# Patient Record
Sex: Male | Born: 1980 | Race: Black or African American | Hispanic: No | Marital: Married | Smoking: Former smoker
Health system: Southern US, Community
[De-identification: ages and names within clinical notes are randomized; demographics above are authoritative.]

## PROBLEM LIST (undated history)

## (undated) DIAGNOSIS — I10 Essential (primary) hypertension: Secondary | ICD-10-CM

## (undated) DIAGNOSIS — I509 Heart failure, unspecified: Secondary | ICD-10-CM

## (undated) DIAGNOSIS — I714 Abdominal aortic aneurysm, without rupture, unspecified: Secondary | ICD-10-CM

## (undated) DIAGNOSIS — I723 Aneurysm of iliac artery: Secondary | ICD-10-CM

## (undated) DIAGNOSIS — N2889 Other specified disorders of kidney and ureter: Secondary | ICD-10-CM

## (undated) HISTORY — DX: Abdominal aortic aneurysm, without rupture, unspecified: I71.40

## (undated) HISTORY — DX: Abdominal aortic aneurysm, without rupture: I71.4

## (undated) HISTORY — DX: Other specified disorders of kidney and ureter: N28.89

## (undated) HISTORY — DX: Aneurysm of iliac artery: I72.3

---

## 2014-03-11 ENCOUNTER — Encounter (HOSPITAL_COMMUNITY): Payer: Self-pay | Admitting: Emergency Medicine

## 2014-03-11 ENCOUNTER — Emergency Department (HOSPITAL_COMMUNITY)
Admission: EM | Admit: 2014-03-11 | Discharge: 2014-03-11 | Disposition: A | Discharge status: Home / Self Care | Payer: Self-pay | Attending: Emergency Medicine | Admitting: Emergency Medicine

## 2014-03-11 DIAGNOSIS — I509 Heart failure, unspecified: Secondary | ICD-10-CM | POA: Insufficient documentation

## 2014-03-11 DIAGNOSIS — R55 Syncope and collapse: Secondary | ICD-10-CM | POA: Insufficient documentation

## 2014-03-11 DIAGNOSIS — I10 Essential (primary) hypertension: Secondary | ICD-10-CM | POA: Insufficient documentation

## 2014-03-11 HISTORY — DX: Essential (primary) hypertension: I10

## 2014-03-11 HISTORY — DX: Heart failure, unspecified: I50.9

## 2014-03-11 LAB — BASIC METABOLIC PANEL
Anion gap: 15 (ref 5–15)
BUN: 30 mg/dL — AB (ref 6–23)
CHLORIDE: 102 meq/L (ref 96–112)
CO2: 20 mEq/L (ref 19–32)
Calcium: 9.2 mg/dL (ref 8.4–10.5)
Creatinine, Ser: 1.4 mg/dL — ABNORMAL HIGH (ref 0.50–1.35)
GFR calc Af Amer: 75 mL/min — ABNORMAL LOW (ref >90)
GFR, EST NON AFRICAN AMERICAN: 64 mL/min — AB (ref >90)
Glucose, Bld: 105 mg/dL — ABNORMAL HIGH (ref 70–99)
Potassium: 4.8 mEq/L (ref 3.7–5.3)
Sodium: 137 mEq/L (ref 137–147)

## 2014-03-11 LAB — CBC
HEMATOCRIT: 42.4 % (ref 39.0–52.0)
HEMOGLOBIN: 15.3 g/dL (ref 13.0–17.0)
MCH: 27.5 pg (ref 26.0–34.0)
MCHC: 36.1 g/dL — AB (ref 30.0–36.0)
MCV: 76.3 fL — ABNORMAL LOW (ref 78.0–100.0)
Platelets: 203 10*3/uL (ref 150–400)
RBC: 5.56 MIL/uL (ref 4.22–5.81)
RDW: 13.5 % (ref 11.5–15.5)
WBC: 7.2 10*3/uL (ref 4.0–10.5)

## 2014-03-11 LAB — I-STAT TROPONIN, ED: Troponin i, poc: 0 ng/mL (ref 0.00–0.08)

## 2014-03-11 NOTE — ED Notes (Signed)
Pt ambulated in hall, tolerated well. Denied lightheadedness or dizziness. Pt given orange juice after walking.

## 2014-03-11 NOTE — ED Notes (Signed)
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

## 2014-03-11 NOTE — ED Notes (Signed)
Feeling bad, lightheaded, and weakness prior to work.  Has had significant weight loss. "feels congested."  bp 95/60, hr 100, sr, ems gave ns 300; piv 18 ga. Alert and oriented.

## 2014-03-11 NOTE — Discharge Instructions (Signed)
You need to be seen by your regular doctor in the next couple of days to a week.  Your kidney function test was slightly higher than baseline, you will need to get plenty to drink and have this test rechecked in one week to ensure that it is improving.  Near-Syncope Near-syncope (commonly known as near fainting) is sudden weakness, dizziness, or feeling like you might pass out. During an episode of near-syncope, you may also develop pale skin, have tunnel vision, or feel sick to your stomach (nauseous). Near-syncope may occur when getting up after sitting or while standing for a long time. It is caused by a sudden decrease in blood flow to the brain. This decrease can result from various causes or triggers, most of which are not serious. However, because near-syncope can sometimes be a sign of something serious, a medical evaluation is required. The specific cause is often not determined. HOME CARE INSTRUCTIONS  Monitor your condition for any changes. The following actions may help to alleviate any discomfort you are experiencing:  Have someone stay with you until you feel stable.  Lie down right away and prop your feet up if you start feeling like you might faint. Breathe deeply and steadily. Wait until all the symptoms have passed. Most of these episodes last only a few minutes. You may feel tired for several hours.   Drink enough fluids to keep your urine clear or pale yellow.   If you are taking blood pressure or heart medicine, get up slowly when seated or lying down. Take several minutes to sit and then stand. This can reduce dizziness.  Follow up with your health care provider as directed. SEEK IMMEDIATE MEDICAL CARE IF:   You have a severe headache.   You have unusual pain in the chest, abdomen, or back.   You are bleeding from the mouth or rectum, or you have black or tarry stool.   You have an irregular or very fast heartbeat.   You have repeated fainting or have  seizure-like jerking during an episode.   You faint when sitting or lying down.   You have confusion.   You have difficulty walking.   You have severe weakness.   You have vision problems.  MAKE SURE YOU:   Understand these instructions.  Will watch your condition.  Will get help right away if you are not doing well or get worse. Document Released: 05/15/2005 Document Revised: 05/20/2013 Document Reviewed: 10/18/2012 [facility] Patient Information 2015 [place], [place]. This information is not intended to replace advice given to you by your health care provider. Make sure you discuss any questions you have with your health care provider.

## 2014-03-11 NOTE — ED Provider Notes (Signed)
Medical screening examination/treatment/procedure(s) were performed by non-physician practitioner and as supervising physician I was immediately available for consultation/collaboration.    Johnna Acosta, MD 03/11/14 2116

## 2014-03-11 NOTE — ED Notes (Signed)
Pt given [place] sandwich and applesauce per PA's request

## 2014-03-11 NOTE — ED Provider Notes (Signed)
CSN: [id number]     Arrival date & time 03/11/14  0747 History   First MD Initiated Contact with Patient 03/11/14 0756     Chief Complaint  Patient presents with  . Dizziness     (Consider location/radiation/quality/duration/timing/severity/associated sxs/prior Treatment) HPI Comments:  Patient presents emergency department with chief complaint of dizziness. He states that the symptoms started this morning at around 5:00 after he awoke. He states the dizziness felt like a feeling of lightheadedness. He denies any room spinning sensation or vertigo. He denies any associated chest pain or shortness of breath. States that when he took his blood pressure, it was lower than normal. His employer advised that he come to the emergency department for evaluation.  The history is provided by the patient. No language interpreter was used.    Past Medical History  Diagnosis Date  . Hypertension   . CHF (congestive heart failure)    No past surgical history on file. History reviewed. No pertinent family history. History  Substance Use Topics  . Smoking status: Never Smoker   . Smokeless tobacco: Not on file  . Alcohol Use: No    Review of Systems  All other systems reviewed and are negative.     Allergies  Review of patient's allergies indicates no known allergies.  Home Medications   Prior to Admission medications   Not on File   BP 101/74  Pulse 68  Temp(Src) 98.5 F (36.9 C) (Oral)  Resp 18  SpO2 100% Physical Exam  Nursing note and vitals reviewed. Constitutional: He is oriented to person, place, and time. He appears well-developed and well-nourished.  HENT:  Head: Normocephalic and atraumatic.  Eyes: Conjunctivae and EOM are normal. Pupils are equal, round, and reactive to light. Right eye exhibits no discharge. Left eye exhibits no discharge. No scleral icterus.  Neck: Normal range of motion. Neck supple. No JVD present.  Cardiovascular: Normal rate, regular rhythm  and normal heart sounds.  Exam reveals no gallop and no friction rub.   No murmur heard. Pulmonary/Chest: Effort normal and breath sounds normal. No respiratory distress. He has no wheezes. He has no rales. He exhibits no tenderness.  Clear to auscultation bilaterally  Abdominal: Soft. He exhibits no distension and no mass. There is no tenderness. There is no rebound and no guarding.  Musculoskeletal: Normal range of motion. He exhibits no edema and no tenderness.  Neurological: He is alert and oriented to person, place, and time.  Skin: Skin is warm and dry.  Psychiatric: He has a normal mood and affect. His behavior is normal. Judgment and thought content normal.    ED Course  Procedures (including critical care time) Results for orders placed during the hospital encounter of 03/11/14  CBC      Result Value Ref Range   WBC 7.2  4.0 - 10.5 K/uL   RBC 5.56  4.22 - 5.81 MIL/uL   Hemoglobin 15.3  13.0 - 17.0 g/dL   HCT 42.4  39.0 - 52.0 %   MCV 76.3 (*) 78.0 - 100.0 fL   MCH 27.5  26.0 - 34.0 pg   MCHC 36.1 (*) 30.0 - 36.0 g/dL   RDW 13.5  11.5 - 15.5 %   Platelets 203  150 - 400 K/uL  BASIC METABOLIC PANEL      Result Value Ref Range   Sodium 137  137 - 147 mEq/L   Potassium 4.8  3.7 - 5.3 mEq/L   Chloride 102  96 - 112  mEq/L   CO2 20  19 - 32 mEq/L   Glucose, Bld 105 (*) 70 - 99 mg/dL   BUN 30 (*) 6 - 23 mg/dL   Creatinine, Ser 1.40 (*) 0.50 - 1.35 mg/dL   Calcium 9.2  8.4 - 10.5 mg/dL   GFR calc non Af Amer 64 (*) >90 mL/min   GFR calc Af Amer 75 (*) >90 mL/min   Anion gap 15  5 - 15  I-STAT TROPOININ, ED      Result Value Ref Range   Troponin i, poc 0.00  0.00 - 0.08 ng/mL   Comment 3            No results found.   Imaging Review No results found.   EKG Interpretation   Date/Time:  Wednesday March 11 2014 07:56:29 EDT Ventricular Rate:  63 PR Interval:  154 QRS Duration: 92 QT Interval:  377 QTC Calculation: 386 R Axis:   8 Text Interpretation:  Sinus  rhythm Borderline ST elevation, anterolateral  leads Abnormal ekg No old tracing to compare Confirmed by MILLER  MD,  [place] ([id number]) on 03/11/2014 8:27:34 AM      MDM   Final diagnoses:  Near syncope    Patient with feeling of dizziness. He does have a history of CHF. Will check labs and EKG and orthostatic vitals.  Patient is feeling better now.  9:34 AM EKG is unremarkable.  No arrhythmias.  CBC is stable, no evidence of anemia. BNP is normal. No chest pain or shortness of breath. Does not sound vertiginous.  Onset was this morning, doubt any intracranial process. Cr is 1.4.  No baseline to compare to.  Recommend fluids and recheck in 1 week. Discussed with Dr. Sabra Heck, who agrees with plan. Will fluid challenge, ambulate the patient, and anticipate discharge to home.   Montine Circle, PA-C 03/11/14 1026

## 2015-01-04 ENCOUNTER — Encounter (HOSPITAL_COMMUNITY): Payer: Self-pay | Admitting: Emergency Medicine

## 2015-01-04 ENCOUNTER — Emergency Department (HOSPITAL_COMMUNITY)
Admission: EM | Admit: 2015-01-04 | Discharge: 2015-01-04 | Disposition: A | Discharge status: Home / Self Care | Payer: Self-pay | Attending: Emergency Medicine | Admitting: Emergency Medicine

## 2015-01-04 DIAGNOSIS — Z79899 Other long term (current) drug therapy: Secondary | ICD-10-CM | POA: Insufficient documentation

## 2015-01-04 DIAGNOSIS — M109 Gout, unspecified: Secondary | ICD-10-CM

## 2015-01-04 DIAGNOSIS — I509 Heart failure, unspecified: Secondary | ICD-10-CM | POA: Insufficient documentation

## 2015-01-04 DIAGNOSIS — Z720 Tobacco use: Secondary | ICD-10-CM | POA: Insufficient documentation

## 2015-01-04 DIAGNOSIS — I10 Essential (primary) hypertension: Secondary | ICD-10-CM | POA: Insufficient documentation

## 2015-01-04 MED ORDER — PREDNISONE 20 MG PO TABS
60.0000 mg | ORAL_TABLET | Freq: Once | ORAL | Status: AC
  Administered 2015-01-04: 60 mg via ORAL
  Filled 2015-01-04: qty 3

## 2015-01-04 MED ORDER — OXYCODONE-ACETAMINOPHEN 5-325 MG PO TABS
2.0000 | ORAL_TABLET | Freq: Once | ORAL | Status: AC
  Administered 2015-01-04: 2 via ORAL
  Filled 2015-01-04: qty 2

## 2015-01-04 MED ORDER — PREDNISONE 20 MG PO TABS: 40.0000 mg | ORAL_TABLET | Freq: Every day | ORAL | Status: DC

## 2015-01-04 MED ORDER — OXYCODONE-ACETAMINOPHEN 5-325 MG PO TABS: 1.0000 | ORAL_TABLET | ORAL | Status: DC | PRN

## 2015-01-04 NOTE — ED Notes (Signed)
Pt reports that his "gout" started to "flair up" last Thursday.  His left elbow, knee and toes are swollen and are causing him pain.

## 2015-01-04 NOTE — ED Provider Notes (Signed)
CSN: [id number]     Arrival date & time 01/04/15  0600 History   First MD Initiated Contact with Patient 01/04/15 0636     Chief Complaint  Patient presents with  . Joint Swelling     (Consider location/radiation/quality/duration/timing/severity/associated sxs/prior Treatment) The history is provided by the patient and medical records.   This is a 34 year old male with history of hypertension and congestive heart failure, presenting to the ED for joint swelling. Patient has a history of gout, states he feels he is having a "flareup". Patient states swelling began on Thursday (12/31/14) but has been worsening since this time.  He states swelling and pain of his left elbow and left knee.  He states joints feel warm to touch.  States similar symptoms in the past with prior gout flares.  He denies numbness or weakness.  No new injuries, trauma, or falls. No fever, chills, sweats.  Remains ambulatory without difficulty.  Denies consumption of red meat, fish, or alcohol recently.  He does not take any maintenance medications for his gout.  Taken motrin PTA without relief.  Past Medical History  Diagnosis Date  . Hypertension   . CHF (congestive heart failure)    History reviewed. No pertinent past surgical history. History reviewed. No pertinent family history. History  Substance Use Topics  . Smoking status: Current Every Day Smoker -- 1.00 packs/day    Types: Cigarettes  . Smokeless tobacco: Not on file  . Alcohol Use: Yes     Comment: daily "when I can"    Review of Systems  Musculoskeletal: Positive for joint swelling and arthralgias.  All other systems reviewed and are negative.     Allergies  Review of patient's allergies indicates no known allergies.  Home Medications   Prior to Admission medications   Medication Sig Start Date End Date Taking? Authorizing Provider  amLODipine (NORVASC) 10 MG tablet Take 10 mg by mouth daily.    Historical Provider, MD  benazepril  (LOTENSIN) 40 MG tablet Take 40 mg by mouth daily.    Historical Provider, MD  carvedilol (COREG) 25 MG tablet Take 25 mg by mouth 2 (two) times daily with a meal.    Historical Provider, MD  furosemide (LASIX) 80 MG tablet Take 80 mg by mouth daily.    Historical Provider, MD  simvastatin (ZOCOR) 20 MG tablet Take 20 mg by mouth every evening.    Historical Provider, MD   BP 140/87 mmHg  Pulse 79  Temp(Src) 98.4 F (36.9 C) (Oral)  Resp 18  Ht 5\' 4"  (1.626 m)  Wt 165 lb (74.844 kg)  BMI 28.31 kg/m2  SpO2 98%   Physical Exam  Constitutional: He is oriented to person, place, and time. He appears well-developed and well-nourished. No distress.  HENT:  Head: Normocephalic and atraumatic.  Mouth/Throat: Oropharynx is clear and moist.  Eyes: Conjunctivae and EOM are normal. Pupils are equal, round, and reactive to light.  Neck: Normal range of motion. Neck supple.  Cardiovascular: Normal rate, regular rhythm and normal heart sounds.   Pulmonary/Chest: Effort normal and breath sounds normal. No respiratory distress. He has no wheezes.  Abdominal: Soft. Bowel sounds are normal.  Musculoskeletal: Normal range of motion. He exhibits no edema.  Swelling and tenderness of left elbow and left knee; slight erythema and warmth to touch noted; no lymphangitis or streaking of either extremity; extremities are neurovascularly intact; compartments soft and easily compressible  Neurological: He is alert and oriented to person, place, and time.  Skin: Skin is warm and dry. He is not diaphoretic.  Psychiatric: He has a normal mood and affect.  Nursing note and vitals reviewed.   ED Course  Procedures (including critical care time) Labs Review Labs Reviewed - No data to display  Imaging Review No results found.   EKG Interpretation None      MDM   Final diagnoses:  Acute gout of left elbow, unspecified cause  Acute gout of left knee, unspecified cause   34 year old male here with gout  flare. He is afebrile, nontoxic. He does have swelling and tenderness of left elbow and left knee with slight erythema and warmth to touch.  Patient has history of gout with similar symptoms. I do not suspect septic joint or DVT at this time. Patient will be started on Percocet and prednisone, first dose given in ED.  He does not have a PCP currently, will be given follow-up [facility].  Discussed plan with patient, he/she acknowledged understanding and agreed with plan of care.  Return precautions given for new or worsening symptoms.  Larene Pickett, PA-C 01/04/15 5329  Veryl Speak, MD 01/07/15 [phone]

## 2015-01-04 NOTE — Discharge Instructions (Signed)
Take the prescribed medication as directed. Follow-up with the [facility]-- call to make appt. Return to the ED for new or worsening symptoms.

## 2015-01-22 ENCOUNTER — Ambulatory Visit (HOSPITAL_COMMUNITY)
Admission: RE | Admit: 2015-01-22 | Discharge: 2015-01-22 | Disposition: A | Discharge status: Home / Self Care | Payer: Self-pay | Source: Ambulatory Visit | Attending: Family Medicine | Admitting: Family Medicine

## 2015-01-22 ENCOUNTER — Ambulatory Visit (INDEPENDENT_AMBULATORY_CARE_PROVIDER_SITE_OTHER): Payer: Self-pay | Admitting: Family Medicine

## 2015-01-22 ENCOUNTER — Encounter: Payer: Self-pay | Admitting: Family Medicine

## 2015-01-22 VITALS — BP 138/93 | HR 72 | Temp 98.5°F | Resp 16 | Ht 64.0 in | Wt 173.0 lb

## 2015-01-22 DIAGNOSIS — I509 Heart failure, unspecified: Secondary | ICD-10-CM

## 2015-01-22 DIAGNOSIS — Z0000 Encounter for general adult medical examination without abnormal findings: Secondary | ICD-10-CM

## 2015-01-22 DIAGNOSIS — I10 Essential (primary) hypertension: Secondary | ICD-10-CM | POA: Insufficient documentation

## 2015-01-22 LAB — CBC WITH DIFFERENTIAL/PLATELET
BASOS ABS: 0 10*3/uL (ref 0.0–0.1)
BASOS PCT: 0 % (ref 0–1)
Eosinophils Absolute: 0.2 10*3/uL (ref 0.0–0.7)
Eosinophils Relative: 3 % (ref 0–5)
HCT: 44 % (ref 39.0–52.0)
HEMOGLOBIN: 15.4 g/dL (ref 13.0–17.0)
Lymphocytes Relative: 21 % (ref 12–46)
Lymphs Abs: 1.6 10*3/uL (ref 0.7–4.0)
MCH: 27.2 pg (ref 26.0–34.0)
MCHC: 35 g/dL (ref 30.0–36.0)
MCV: 77.7 fL — ABNORMAL LOW (ref 78.0–100.0)
MONO ABS: 0.7 10*3/uL (ref 0.1–1.0)
Monocytes Relative: 9 % (ref 3–12)
Neutro Abs: 5.2 10*3/uL (ref 1.7–7.7)
Neutrophils Relative %: 67 % (ref 43–77)
Platelets: 239 10*3/uL (ref 150–400)
RBC: 5.66 MIL/uL (ref 4.22–5.81)
RDW: 16.1 % — ABNORMAL HIGH (ref 11.5–15.5)
WBC: 7.8 10*3/uL (ref 4.0–10.5)

## 2015-01-22 LAB — COMPLETE METABOLIC PANEL WITH GFR
ALBUMIN: 3.5 g/dL — AB (ref 3.6–5.1)
ALK PHOS: 67 U/L (ref 40–115)
ALT: 19 U/L (ref 9–46)
AST: 18 U/L (ref 10–40)
BUN: 8 mg/dL (ref 7–25)
CALCIUM: 8.9 mg/dL (ref 8.6–10.3)
CO2: 25 mmol/L (ref 20–31)
Chloride: 104 mmol/L (ref 98–110)
Creat: 0.95 mg/dL (ref 0.60–1.35)
GFR, Est African American: >89 mL/min (ref >=60)
GFR, Est Non African American: >89 mL/min (ref >=60)
Glucose, Bld: 79 mg/dL (ref 65–99)
Potassium: 3.8 mmol/L (ref 3.5–5.3)
Sodium: 141 mmol/L (ref 135–146)
Total Bilirubin: 0.6 mg/dL (ref 0.2–1.2)
Total Protein: 6.1 g/dL (ref 6.1–8.1)

## 2015-01-22 LAB — LIPID PANEL
CHOLESTEROL: 161 mg/dL (ref 125–200)
HDL: 39 mg/dL — AB (ref >=40)
LDL Cholesterol: 97 mg/dL (ref <130)
TRIGLYCERIDES: 125 mg/dL (ref <150)
Total CHOL/HDL Ratio: 4.1 Ratio (ref <=5.0)
VLDL: 25 mg/dL (ref <30)

## 2015-01-22 LAB — TSH: TSH: 0.714 u[IU]/mL (ref 0.350–4.500)

## 2015-01-22 MED ORDER — LISINOPRIL-HYDROCHLOROTHIAZIDE 20-25 MG PO TABS: 1.0000 | ORAL_TABLET | Freq: Every day | ORAL | Status: DC

## 2015-01-22 NOTE — Progress Notes (Signed)
Patient ID: Nicholas Larson, male   DOB: 22-Aug-1980, 35 y.o.   MRN: [id number]   Nicholas Larson, is a 34 y.o. male  [id number]  [id number]  DOB - Mar 08, 1981  CC:  Chief Complaint  Patient presents with  . Establish Care    refills        HPI: Nicholas Larson is a 34 y.o. male here to establish care. He has a history of hypertension and CHF. He reports being diagnosed with CHF several years ago. He has been on amlodipine 10, coreg 25, Lasix 80 and benazapril 80, all daily. However he has been out for about a month. Prior to that he had recently been seen in ED for dizziness and low blood pressure.  He reports about 3 years he lost 70 pounds and his medications were never adjusted. His BP today is 138/93 off medication. He denies any shortness of breath, cough, wheezng or pedal edema since he has been of Lasix for this month. He also has a history of an occassional gout flare. He has had none in a couple of years. He denies other chronic illnesses.  No Known Allergies Past Medical History  Diagnosis Date  . Hypertension   . CHF (congestive heart failure)    Current Outpatient Prescriptions on File Prior to Visit  Medication Sig Dispense Refill  . amLODipine (NORVASC) 10 MG tablet Take 10 mg by mouth daily.    . carvedilol (COREG) 25 MG tablet Take 25 mg by mouth 2 (two) times daily with a meal.    . furosemide (LASIX) 80 MG tablet Take 80 mg by mouth daily.    Marland Kitchen oxyCODONE-acetaminophen (PERCOCET/ROXICET) 5-325 MG per tablet Take 1 tablet by mouth every 4 (four) hours as needed. 15 tablet 0  . simvastatin (ZOCOR) 20 MG tablet Take 20 mg by mouth every evening.    . predniSONE (DELTASONE) 20 MG tablet Take 2 tablets (40 mg total) by mouth daily. Take 40 mg by mouth daily for 3 days, then 20mg  by mouth daily for 3 days, then 10mg  daily for 3 days (Patient not taking: Reported on 01/22/2015) 12 tablet 0   No current facility-administered medications on file prior to visit.   No family  history on file. Social History   Social History  . Marital Status: Married    Spouse Name: N/A  . Number of Children: N/A  . Years of Education: N/A   Occupational History  . Not on file.   Social History Main Topics  . Smoking status: Current Every Day Smoker -- 1.00 packs/day    Types: Cigarettes  . Smokeless tobacco: Not on file  . Alcohol Use: Yes     Comment: daily "when I can"  . Drug Use: No  . Sexual Activity: Not on file   Other Topics Concern  . Not on file   Social History Narrative    Review of Systems: Constitutional: Negative for fever, chills, appetite change, weight loss,  Fatigue. Skin: Negative for rashes or lesions of concern. HENT: Negative for ear pain, ear discharge.nose bleeds Eyes: Negative for pain, discharge, redness, itching and visual disturbance. Neck: Negative for pain, stiffness Respiratory: Negative for cough, shortness of breath,   Cardiovascular: Negative for chest pain, palpitations and leg swelling. Gastrointestinal: Negative for abdominal pain, nausea, vomiting, diarrhea, constipations Genitourinary: Negative for dysuria, urgency, frequency, hematuria,  Musculoskeletal: Negative for back pain, joint pain, joint  swelling, and gait problem.Negative for weakness. Neurological: Negative for dizziness, tremors, seizures, syncope,   light-headedness,  numbness and headaches.  Hematological: Negative for easy bruising or bleeding Psychiatric/Behavioral: Negative for depression, anxiety, decreased concentration, confusion   Objective:   Filed Vitals:   01/22/15 1140  BP: 138/93  Pulse: 72  Temp: 98.5 F (36.9 C)  Resp: 16    Physical Exam: Constitutional: Patient appears well-developed and well-nourished. No distress. HENT: Normocephalic, atraumatic, External right and left ear normal. Oropharynx is clear and moist.  Eyes: Conjunctivae and EOM are normal. PERRLA, no scleral icterus. Neck: Normal ROM. Neck supple. No  lymphadenopathy, No thyromegaly. CVS: RRR, S1/S2 +, no murmurs, no gallops, no rubs Pulmonary: Effort and breath sounds normal, no stridor, rhonchi, wheezes, rales.  Abdominal: Soft. Normoactive BS,, no distension, tenderness, rebound or guarding.  Musculoskeletal: Normal range of motion. No edema and no tenderness.  Neuro: Alert.Normal muscle tone coordination. Non-focal Skin: Skin is warm and dry. No rash noted. Not diaphoretic. No erythema. No pallor. Psychiatric: Normal mood and affect. Behavior, judgment, thought content normal.  Lab Results  Component Value Date   WBC 7.2 03/11/2014   HGB 15.3 03/11/2014   HCT 42.4 03/11/2014   MCV 76.3* 03/11/2014   PLT 203 03/11/2014   Lab Results  Component Value Date   CREATININE 1.40* 03/11/2014   BUN 30* 03/11/2014   NA 137 03/11/2014   K 4.8 03/11/2014   CL 102 03/11/2014   CO2 20 03/11/2014    No results found for: HGBA1C Lipid Panel  No results found for: CHOL, TRIG, HDL, CHOLHDL, VLDL, LDLCALC     Assessment and plan:   1. Health care maintenance  - COMPLETE METABOLIC PANEL WITH GFR - CBC with Differential - Lipid panel - TSH - Vitamin D 1,25 dihydroxy  2. Congestive heart failure, unspecified congestive heart failure chronicity, unspecified congestive heart failure type  - DG Chest 2 View; Future  3. Hypertension -lisinopril/hctz 20/25. One po daily, #90 with 3 refills.   Return in about 3 months (around 04/24/2015), or if symptoms worsen or fail to improve.  The patient was given clear instructions to go to ER or return to medical center if symptoms don't improve, worsen or new problems develop. The patient verbalized understanding.    Micheline Chapman FNP  01/22/2015, 12:21 PM

## 2015-01-22 NOTE — Patient Instructions (Signed)
You can go to [facility] at your convenience for a chest x-ray I am prescribing lisinopril/hctz for your blood pressure. Take one daily Come back in 2 weeks for BP check with nurse    DASH Eating Plan DASH stands for "Dietary Approaches to Stop Hypertension." The DASH eating plan is a healthy eating plan that has been shown to reduce high blood pressure (hypertension). Additional health benefits may include reducing the risk of type 2 diabetes mellitus, heart disease, and stroke. The DASH eating plan may also help with weight loss. WHAT DO I NEED TO KNOW ABOUT THE DASH EATING PLAN? For the DASH eating plan, you will follow these general guidelines:  Choose foods with a percent daily value for sodium of less than 5% (as listed on the food label).  Use salt-free seasonings or herbs instead of table salt or sea salt.  Check with your health care provider or pharmacist before using salt substitutes.  Eat lower-sodium products, often labeled as "lower sodium" or "no salt added."  Eat fresh foods.  Eat more vegetables, fruits, and low-fat dairy products.  Choose whole grains. Look for the word "whole" as the first word in the ingredient list.  Choose fish and skinless chicken or [place] more often than red meat. Limit fish, poultry, and meat to 6 oz (170 g) each day.  Limit sweets, desserts, sugars, and sugary drinks.  Choose heart-healthy fats.  Limit cheese to 1 oz (28 g) per day.  Eat more home-cooked food and less restaurant, buffet, and fast food.  Limit fried foods.  Cook foods using methods other than frying.  Limit canned vegetables. If you do use them, rinse them well to decrease the sodium.  When eating at a restaurant, ask that your food be prepared with less salt, or no salt if possible. WHAT FOODS CAN I EAT? Seek help from a dietitian for individual calorie needs. Grains Whole grain or whole wheat bread. Brown rice. Whole grain or whole wheat pasta.  Quinoa, bulgur, and whole grain cereals. Low-sodium cereals. Corn or whole wheat flour tortillas. Whole grain cornbread. Whole grain crackers. Low-sodium crackers. Vegetables Fresh or frozen vegetables (raw, steamed, roasted, or grilled). Low-sodium or reduced-sodium tomato and vegetable juices. Low-sodium or reduced-sodium tomato sauce and paste. Low-sodium or reduced-sodium canned vegetables.  Fruits All fresh, canned (in natural juice), or frozen fruits. Meat and Other Protein Products Ground beef (85% or leaner), grass-fed beef, or beef trimmed of fat. Skinless chicken or [place]. Ground chicken or [place]. Pork trimmed of fat. All fish and seafood. Eggs. Dried beans, peas, or lentils. Unsalted nuts and seeds. Unsalted canned beans. Dairy Low-fat dairy products, such as skim or 1% milk, 2% or reduced-fat cheeses, low-fat ricotta or cottage cheese, or plain low-fat yogurt. Low-sodium or reduced-sodium cheeses. Fats and Oils Tub margarines without trans fats. Light or reduced-fat mayonnaise and salad dressings (reduced sodium). Avocado. Safflower, olive, or canola oils. Natural peanut or almond butter. Other Unsalted popcorn and pretzels. The items listed above may not be a complete list of recommended foods or beverages. Contact your dietitian for more options. WHAT FOODS ARE NOT RECOMMENDED? Grains White bread. White pasta. White rice. Refined cornbread. Bagels and croissants. Crackers that contain trans fat. Vegetables Creamed or fried vegetables. Vegetables in a cheese sauce. Regular canned vegetables. Regular canned tomato sauce and paste. Regular tomato and vegetable juices. Fruits Dried fruits. Canned fruit in light or heavy syrup. Fruit juice. Meat and Other Protein Products Fatty cuts of meat. Ribs,  chicken wings, bacon, sausage, bologna, salami, chitterlings, fatback, hot dogs, bratwurst, and packaged luncheon meats. Salted nuts and seeds. Canned beans with salt. Dairy Whole or 2%  milk, cream, half-and-half, and cream cheese. Whole-fat or sweetened yogurt. Full-fat cheeses or blue cheese. Nondairy creamers and whipped toppings. Processed cheese, cheese spreads, or cheese curds. Condiments Onion and garlic salt, seasoned salt, table salt, and sea salt. Canned and packaged gravies. Worcestershire sauce. Tartar sauce. Barbecue sauce. Teriyaki sauce. Soy sauce, including reduced sodium. Steak sauce. Fish sauce. Oyster sauce. Cocktail sauce. Horseradish. Ketchup and mustard. Meat flavorings and tenderizers. Bouillon cubes. Hot sauce. Tabasco sauce. Marinades. Taco seasonings. Relishes. Fats and Oils Butter, stick margarine, lard, shortening, ghee, and bacon fat. Coconut, palm kernel, or palm oils. Regular salad dressings. Other Pickles and olives. Salted popcorn and pretzels. The items listed above may not be a complete list of foods and beverages to avoid. Contact your dietitian for more information. WHERE CAN I FIND MORE INFORMATION? National Heart, [employer], and [employer] Institute: travelstabloid.com Document Released: 05/04/2011 Document Revised: 09/29/2013 Document Reviewed: 03/19/2013 [facility] Patient Information 2015 [place], [place]. This information is not intended to replace advice given to you by your health care provider. Make sure you discuss any questions you have with your health care provider.

## 2015-01-25 LAB — VITAMIN D 1,25 DIHYDROXY
Vitamin D 1, 25 (OH)2 Total: 43 pg/mL (ref 18–72)
Vitamin D3 1, 25 (OH)2: 43 pg/mL

## 2015-01-26 ENCOUNTER — Telehealth: Payer: Self-pay

## 2015-01-26 NOTE — Telephone Encounter (Signed)
-----   Message from Micheline Chapman, NP sent at 01/24/2015  6:15 PM EDT ----- Chest x-ray shows not congestive heart failure.

## 2015-01-26 NOTE — Telephone Encounter (Signed)
Left message 01/26/2015 @ 9:38am regarding chest xray which showed no congestive heart failure and to call back if any questions. Thanks!

## 2015-06-22 ENCOUNTER — Emergency Department (HOSPITAL_COMMUNITY): Payer: Self-pay

## 2015-06-22 ENCOUNTER — Encounter (HOSPITAL_COMMUNITY): Payer: Self-pay | Admitting: Emergency Medicine

## 2015-06-22 ENCOUNTER — Emergency Department (HOSPITAL_COMMUNITY)
Admission: EM | Admit: 2015-06-22 | Discharge: 2015-06-22 | Disposition: A | Discharge status: Home / Self Care | Payer: Self-pay | Attending: Emergency Medicine | Admitting: Emergency Medicine

## 2015-06-22 DIAGNOSIS — F17210 Nicotine dependence, cigarettes, uncomplicated: Secondary | ICD-10-CM | POA: Insufficient documentation

## 2015-06-22 DIAGNOSIS — S60211A Contusion of right wrist, initial encounter: Secondary | ICD-10-CM | POA: Insufficient documentation

## 2015-06-22 DIAGNOSIS — T148XXA Other injury of unspecified body region, initial encounter: Secondary | ICD-10-CM

## 2015-06-22 DIAGNOSIS — S60811A Abrasion of right wrist, initial encounter: Secondary | ICD-10-CM | POA: Insufficient documentation

## 2015-06-22 DIAGNOSIS — I509 Heart failure, unspecified: Secondary | ICD-10-CM | POA: Insufficient documentation

## 2015-06-22 DIAGNOSIS — W230XXA Caught, crushed, jammed, or pinched between moving objects, initial encounter: Secondary | ICD-10-CM | POA: Insufficient documentation

## 2015-06-22 DIAGNOSIS — Y9289 Other specified places as the place of occurrence of the external cause: Secondary | ICD-10-CM | POA: Insufficient documentation

## 2015-06-22 DIAGNOSIS — Z79899 Other long term (current) drug therapy: Secondary | ICD-10-CM | POA: Insufficient documentation

## 2015-06-22 DIAGNOSIS — I10 Essential (primary) hypertension: Secondary | ICD-10-CM | POA: Insufficient documentation

## 2015-06-22 DIAGNOSIS — Y998 Other external cause status: Secondary | ICD-10-CM | POA: Insufficient documentation

## 2015-06-22 DIAGNOSIS — Y9389 Activity, other specified: Secondary | ICD-10-CM | POA: Insufficient documentation

## 2015-06-22 NOTE — ED Notes (Signed)
Pt states a jack-hammer fell onto right wrist this am. Right wrist with swelling and redness.

## 2015-06-22 NOTE — ED Provider Notes (Signed)
CSN: [id number]     Arrival date & time 06/22/15  1129 History  By signing my name below, I, Nicholas Larson, attest that this documentation has been prepared under the direction and in the presence of Glendell Docker, NP. Electronically Signed: Evonnie Dawes, ED Scribe 06/22/2015 at 12:42 PM.  Chief Complaint  Patient presents with  . Wrist Injury   The history is provided by the patient. No language interpreter was used.   HPI Comments: Nicholas Larson is a 35 y.o. male who presents to the Emergency Department complaining of constant right wrist pain onset 9 AM today. Pt was changing a tire earlier today when the jack slipped. Pt's wrist was caught between tire and edge of vehicle. Patient did not use any ice or medication. No other complaints reported at this time.   Past Medical History  Diagnosis Date  . Hypertension   . CHF (congestive heart failure)    No past surgical history on file. No family history on file. Social History  Substance Use Topics  . Smoking status: Current Every Day Smoker -- 1.00 packs/day    Types: Cigarettes  . Smokeless tobacco: Not on file  . Alcohol Use: Yes     Comment: daily "when I can"    Review of Systems  Musculoskeletal: Positive for arthralgias (Right Wrist).  All other systems reviewed and are negative.  Allergies  Review of patient's allergies indicates no known allergies.  Home Medications   Prior to Admission medications   Medication Sig Start Date End Date Taking? Authorizing Provider  amLODipine (NORVASC) 10 MG tablet Take 10 mg by mouth daily.    Historical Provider, MD  carvedilol (COREG) 25 MG tablet Take 25 mg by mouth 2 (two) times daily with a meal.    Historical Provider, MD  furosemide (LASIX) 80 MG tablet Take 80 mg by mouth daily.    Historical Provider, MD  lisinopril-hydrochlorothiazide (PRINZIDE,ZESTORETIC) 20-25 MG per tablet Take 1 tablet by mouth daily. 01/22/15   Micheline Chapman, NP   oxyCODONE-acetaminophen (PERCOCET/ROXICET) 5-325 MG per tablet Take 1 tablet by mouth every 4 (four) hours as needed. 01/04/15   Larene Pickett, PA-C  predniSONE (DELTASONE) 20 MG tablet Take 2 tablets (40 mg total) by mouth daily. Take 40 mg by mouth daily for 3 days, then 20mg  by mouth daily for 3 days, then 10mg  daily for 3 days Patient not taking: Reported on 01/22/2015 01/04/15   Larene Pickett, PA-C  simvastatin (ZOCOR) 20 MG tablet Take 20 mg by mouth every evening.    Historical Provider, MD   BP 143/93 mmHg  Pulse 107  Temp(Src) 98.3 F (36.8 C) (Oral)  Resp 18  Ht 5\' 4"  (1.626 m)  Wt 166 lb (75.297 kg)  BMI 28.48 kg/m2  SpO2 98%  Physical Exam  Constitutional: He is oriented to person, place, and time. He appears well-developed and well-nourished. No distress.  HENT:  Head: Normocephalic and atraumatic.  Eyes: Conjunctivae and EOM are normal.  Neck: Neck supple. No tracheal deviation present.  Cardiovascular: Normal rate.   Pulmonary/Chest: Effort normal. No respiratory distress.  Musculoskeletal: Normal range of motion.  Neurological: He is alert and oriented to person, place, and time.  Skin: Skin is warm and dry. Abrasion noted. There is erythema.  Some redness and edema. Abrasion on the right wrist. Good bilateral strength and light touch sensation. NVI. Good ROM.   Psychiatric: He has a normal mood and affect. His behavior is normal.  Nursing note  and vitals reviewed.  ED Course  Procedures (including critical care time) DIAGNOSTIC STUDIES: Oxygen Saturation is 98% on RA, normal by my interpretation.    COORDINATION OF CARE: 11:58 AM-Discussed treatment plan which includes DG right wrist  with pt at bedside and pt agreed to plan.   Labs Review Labs Reviewed - No data to display  Imaging Review Dg Wrist Complete Right  06/22/2015  CLINICAL DATA:  Right wrist pain and deformity after injury changing tire. EXAM: RIGHT WRIST - COMPLETE 3+ VIEW COMPARISON:  None.  FINDINGS: The mineralization and alignment are normal. There is no evidence of acute fracture or dislocation. The joint spaces are maintained. No foreign bodies are identified. There is prominent dorsal soft tissue swelling on the lateral view. IMPRESSION: Dorsal soft tissue swelling without evidence of acute fracture or dislocation. Electronically Signed   By: Richardean Sale M.D.   On: 06/22/2015 12:30   I have personally reviewed and evaluated these images and lab results as part of my medical decision-making.   EKG Interpretation None      MDM   Final diagnoses:  Contusion  Abrasion    X-ray negative. Will treat with ace wrap for comfort  I personally performed the services described in this documentation, which was scribed in my presence. The recorded information has been reviewed and is accurate.      Glendell Docker, NP 06/22/15 1243  Gareth Morgan, MD 06/22/15 2321

## 2015-06-22 NOTE — ED Notes (Signed)
Pt ambulates independently and with steady gait at time of discharge. Discharge instructions and follow up information reviewed with patient. No other questions or concerns voiced at this time.  

## 2016-03-04 ENCOUNTER — Other Ambulatory Visit: Payer: Self-pay | Admitting: Family Medicine

## 2016-03-20 ENCOUNTER — Other Ambulatory Visit: Payer: Self-pay | Admitting: Family Medicine

## 2016-03-20 DIAGNOSIS — I509 Heart failure, unspecified: Secondary | ICD-10-CM

## 2016-04-06 ENCOUNTER — Other Ambulatory Visit (HOSPITAL_COMMUNITY): Payer: BLUE CROSS/BLUE SHIELD

## 2016-04-24 ENCOUNTER — Other Ambulatory Visit (HOSPITAL_COMMUNITY): Payer: BLUE CROSS/BLUE SHIELD

## 2016-05-15 ENCOUNTER — Ambulatory Visit (HOSPITAL_COMMUNITY): Payer: BLUE CROSS/BLUE SHIELD | Attending: Internal Medicine

## 2016-05-15 ENCOUNTER — Other Ambulatory Visit: Payer: Self-pay

## 2016-05-15 DIAGNOSIS — I509 Heart failure, unspecified: Secondary | ICD-10-CM | POA: Insufficient documentation

## 2016-05-15 DIAGNOSIS — I253 Aneurysm of heart: Secondary | ICD-10-CM | POA: Insufficient documentation

## 2016-05-15 DIAGNOSIS — Z720 Tobacco use: Secondary | ICD-10-CM | POA: Diagnosis not present

## 2016-05-15 DIAGNOSIS — I110 Hypertensive heart disease with heart failure: Secondary | ICD-10-CM | POA: Insufficient documentation

## 2016-05-25 ENCOUNTER — Emergency Department (HOSPITAL_COMMUNITY): Payer: BLUE CROSS/BLUE SHIELD

## 2016-05-25 ENCOUNTER — Encounter (HOSPITAL_COMMUNITY): Payer: Self-pay | Admitting: Emergency Medicine

## 2016-05-25 ENCOUNTER — Inpatient Hospital Stay (HOSPITAL_COMMUNITY)
Admission: EM | Admit: 2016-05-25 | Discharge: 2016-05-28 | DRG: 271 | Disposition: A | Discharge status: Home / Self Care | Payer: BLUE CROSS/BLUE SHIELD | Attending: Internal Medicine | Admitting: Internal Medicine

## 2016-05-25 DIAGNOSIS — I10 Essential (primary) hypertension: Secondary | ICD-10-CM

## 2016-05-25 DIAGNOSIS — Z79899 Other long term (current) drug therapy: Secondary | ICD-10-CM

## 2016-05-25 DIAGNOSIS — Z6830 Body mass index (BMI) 30.0-30.9, adult: Secondary | ICD-10-CM | POA: Diagnosis not present

## 2016-05-25 DIAGNOSIS — I110 Hypertensive heart disease with heart failure: Secondary | ICD-10-CM | POA: Diagnosis present

## 2016-05-25 DIAGNOSIS — I714 Abdominal aortic aneurysm, without rupture, unspecified: Secondary | ICD-10-CM

## 2016-05-25 DIAGNOSIS — I723 Aneurysm of iliac artery: Secondary | ICD-10-CM | POA: Diagnosis present

## 2016-05-25 DIAGNOSIS — I5032 Chronic diastolic (congestive) heart failure: Secondary | ICD-10-CM | POA: Diagnosis present

## 2016-05-25 DIAGNOSIS — N2889 Other specified disorders of kidney and ureter: Secondary | ICD-10-CM | POA: Diagnosis present

## 2016-05-25 DIAGNOSIS — F17210 Nicotine dependence, cigarettes, uncomplicated: Secondary | ICD-10-CM | POA: Diagnosis present

## 2016-05-25 DIAGNOSIS — I671 Cerebral aneurysm, nonruptured: Secondary | ICD-10-CM | POA: Diagnosis not present

## 2016-05-25 DIAGNOSIS — Z8679 Personal history of other diseases of the circulatory system: Secondary | ICD-10-CM

## 2016-05-25 DIAGNOSIS — Z9889 Other specified postprocedural states: Secondary | ICD-10-CM

## 2016-05-25 LAB — URINALYSIS, ROUTINE W REFLEX MICROSCOPIC
Bacteria, UA: NONE SEEN
Bilirubin Urine: NEGATIVE
Glucose, UA: NEGATIVE mg/dL
Ketones, ur: NEGATIVE mg/dL
Leukocytes, UA: NEGATIVE
Nitrite: NEGATIVE
PROTEIN: 100 mg/dL — AB
Specific Gravity, Urine: 1.009 (ref 1.005–1.030)
Squamous Epithelial / LPF: NONE SEEN
pH: 6 (ref 5.0–8.0)

## 2016-05-25 LAB — CBC
HEMATOCRIT: 50 % (ref 39.0–52.0)
HEMOGLOBIN: 17.9 g/dL — AB (ref 13.0–17.0)
MCH: 28.1 pg (ref 26.0–34.0)
MCHC: 35.8 g/dL (ref 30.0–36.0)
MCV: 78.4 fL (ref 78.0–100.0)
Platelets: 175 10*3/uL (ref 150–400)
RBC: 6.38 MIL/uL — ABNORMAL HIGH (ref 4.22–5.81)
RDW: 13.9 % (ref 11.5–15.5)
WBC: 11.5 10*3/uL — AB (ref 4.0–10.5)

## 2016-05-25 LAB — COMPREHENSIVE METABOLIC PANEL
ALBUMIN: 3.8 g/dL (ref 3.5–5.0)
ALK PHOS: 82 U/L (ref 38–126)
ALT: 20 U/L (ref 17–63)
AST: 18 U/L (ref 15–41)
Anion gap: 9 (ref 5–15)
BILIRUBIN TOTAL: 0.8 mg/dL (ref 0.3–1.2)
BUN: 12 mg/dL (ref 6–20)
CALCIUM: 8.9 mg/dL (ref 8.9–10.3)
CO2: 27 mmol/L (ref 22–32)
Chloride: 101 mmol/L (ref 101–111)
Creatinine, Ser: 0.9 mg/dL (ref 0.61–1.24)
GFR calc Af Amer: >60 mL/min (ref >60)
GFR calc non Af Amer: >60 mL/min (ref >60)
GLUCOSE: 94 mg/dL (ref 65–99)
Potassium: 3.5 mmol/L (ref 3.5–5.1)
Sodium: 137 mmol/L (ref 135–145)
TOTAL PROTEIN: 7.3 g/dL (ref 6.5–8.1)

## 2016-05-25 LAB — MRSA PCR SCREENING: MRSA by PCR: NEGATIVE

## 2016-05-25 MED ORDER — NITROGLYCERIN IN D5W 200-5 MCG/ML-% IV SOLN
0.0000 ug/min | INTRAVENOUS | Status: DC
  Administered 2016-05-25: 5 ug/min via INTRAVENOUS
  Filled 2016-05-25: qty 250

## 2016-05-25 MED ORDER — SODIUM CHLORIDE 0.9% FLUSH
3.0000 mL | Freq: Two times a day (BID) | INTRAVENOUS | Status: DC
  Administered 2016-05-25 – 2016-05-28 (×4): 3 mL via INTRAVENOUS

## 2016-05-25 MED ORDER — OXYCODONE-ACETAMINOPHEN 5-325 MG PO TABS
1.0000 | ORAL_TABLET | Freq: Once | ORAL | Status: AC
  Administered 2016-05-25: 1 via ORAL
  Filled 2016-05-25: qty 1

## 2016-05-25 MED ORDER — ACETAMINOPHEN 325 MG PO TABS: 650.0000 mg | ORAL_TABLET | Freq: Four times a day (QID) | ORAL | Status: DC | PRN

## 2016-05-25 MED ORDER — LABETALOL HCL 5 MG/ML IV SOLN
10.0000 mg | Freq: Once | INTRAVENOUS | Status: AC
  Administered 2016-05-25: 10 mg via INTRAVENOUS
  Filled 2016-05-25: qty 4

## 2016-05-25 MED ORDER — ONDANSETRON HCL 4 MG/2ML IJ SOLN: 4.0000 mg | Freq: Four times a day (QID) | INTRAMUSCULAR | Status: DC | PRN

## 2016-05-25 MED ORDER — CEFAZOLIN IN D5W 1 GM/50ML IV SOLN
1.0000 g | INTRAVENOUS | Status: DC
  Filled 2016-05-25 (×2): qty 50

## 2016-05-25 MED ORDER — ONDANSETRON HCL 4 MG PO TABS: 4.0000 mg | ORAL_TABLET | Freq: Four times a day (QID) | ORAL | Status: DC | PRN

## 2016-05-25 MED ORDER — TRAMADOL HCL 50 MG PO TABS
50.0000 mg | ORAL_TABLET | Freq: Four times a day (QID) | ORAL | Status: DC | PRN
  Administered 2016-05-25: 50 mg via ORAL
  Filled 2016-05-25 (×2): qty 1

## 2016-05-25 MED ORDER — IOPAMIDOL (ISOVUE-370) INJECTION 76%
INTRAVENOUS | Status: AC
  Administered 2016-05-25: 100 mL via INTRAVENOUS
  Filled 2016-05-25: qty 100

## 2016-05-25 MED ORDER — LABETALOL HCL 5 MG/ML IV SOLN
10.0000 mg | INTRAVENOUS | Status: DC | PRN
  Administered 2016-05-25 – 2016-05-26 (×3): 10 mg via INTRAVENOUS
  Filled 2016-05-25 (×4): qty 4

## 2016-05-25 MED ORDER — ACETAMINOPHEN 650 MG RE SUPP: 650.0000 mg | Freq: Four times a day (QID) | RECTAL | Status: DC | PRN

## 2016-05-25 MED ORDER — HYDRALAZINE HCL 20 MG/ML IJ SOLN
10.0000 mg | Freq: Once | INTRAMUSCULAR | Status: AC
  Administered 2016-05-25: 10 mg via INTRAVENOUS
  Filled 2016-05-25: qty 1

## 2016-05-25 NOTE — ED Provider Notes (Signed)
[facility] DEPT Provider Note   CSN: [id number] Arrival date & time: 05/25/16  [id number]     History   Chief Complaint Chief Complaint  Patient presents with  . Groin Pain    HPI Nicholas Larson is a 35 y.o. male history of hypertension, CHF presenting with left groin pain since last night at 10 PM. Patient states that he pain woke him up and gradually worsened. He states the pain is continuous dull pain and is now a 4/10. Patient has not taken anything for the pain. Patient reports some nausea and hasn't had any food today. Patient reports having flatulence and bowel movement today with no changes, no blood noted. Patient denies any hematuria or urinary symptoms. Patient denies any tenderness to palpation. Patient denies any chest pain, shortness of breath, vomiting, renal stone history or family history of renal stones.     Groin Pain  Pertinent negatives include no chest pain, no abdominal pain and no shortness of breath.    Past Medical History:  Diagnosis Date  . CHF (congestive heart failure) ([place])   . Hypertension     There are no active problems to display for this patient.   History reviewed. No pertinent surgical history.     Home Medications    Prior to Admission medications   Medication Sig Start Date End Date Taking? Authorizing Provider  lisinopril (PRINIVIL,ZESTRIL) 20 MG tablet Take 20 mg by mouth daily.   Yes Historical Provider, MD  lisinopril-hydrochlorothiazide (PRINZIDE,ZESTORETIC) 20-25 MG per tablet Take 1 tablet by mouth daily. Patient not taking: Reported on 05/25/2016 01/22/15   Micheline Chapman, NP  oxyCODONE-acetaminophen (PERCOCET/ROXICET) 5-325 MG per tablet Take 1 tablet by mouth every 4 (four) hours as needed. Patient not taking: Reported on 05/25/2016 01/04/15   Larene Pickett, PA-C  predniSONE (DELTASONE) 20 MG tablet Take 2 tablets (40 mg total) by mouth daily. Take 40 mg by mouth daily for 3 days, then 20mg  by mouth daily for 3 days, then  10mg  daily for 3 days Patient not taking: Reported on 05/25/2016 01/04/15   Larene Pickett, PA-C    Family History Family History  Problem Relation Age of Onset  . Hypertension Mother   . Aneurysm Father     head    Social History Social History  Substance Use Topics  . Smoking status: Former Smoker    Packs/day: 1.00    Types: Cigarettes  . Smokeless tobacco: Not on file  . Alcohol use Yes     Comment: daily "when I can"     Allergies   Patient has no known allergies.   Review of Systems Review of Systems  Constitutional: Negative for chills and fever.  HENT: Negative for sore throat.   Eyes: Negative for visual disturbance.  Respiratory: Negative for cough and shortness of breath.   Cardiovascular: Negative for chest pain and palpitations.  Gastrointestinal: Positive for nausea. Negative for abdominal pain, blood in stool, diarrhea and vomiting.  Genitourinary: Negative for difficulty urinating, dysuria and hematuria.  Musculoskeletal: Negative for arthralgias and back pain.       Left groin pain.   Skin: Negative for color change, rash and wound.  Neurological: Negative for syncope.     Physical Exam Updated Vital Signs BP (!) 188/119 (BP Location: Right Arm)   Pulse 74   Temp 99.2 F (37.3 C) (Oral)   Resp 12   SpO2 98%   Physical Exam  Constitutional: He is oriented to person, place, and time.  He appears well-developed and well-nourished.  HENT:  Head: Normocephalic and atraumatic.  Nose: Nose normal.  Eyes: Conjunctivae and EOM are normal. Pupils are equal, round, and reactive to light.  Neck: Normal range of motion. Neck supple.  Cardiovascular: Normal rate and normal heart sounds.   Pulmonary/Chest: Effort normal and breath sounds normal. No respiratory distress. He exhibits no tenderness.  Abdominal: Soft. Bowel sounds are normal. He exhibits no pulsatile midline mass and no mass. There is no tenderness. There is no rebound and no guarding.    Genitourinary: Testes normal and penis normal. Cremasteric reflex is present. Right testis shows no mass, no swelling and no tenderness. Left testis shows no mass, no swelling and no tenderness. No penile tenderness.  Genitourinary Comments: Exam was chaperoned.  Musculoskeletal: Normal range of motion.  Lymphadenopathy: No inguinal adenopathy noted on the right or left side.  Neurological: He is alert and oriented to person, place, and time.  Skin: Skin is warm. Capillary refill takes less than 2 seconds.  Psychiatric: He has a normal mood and affect. His behavior is normal.  Nursing note and vitals reviewed.    ED Treatments / Results  Labs (all labs ordered are listed, but only abnormal results are displayed) Labs Reviewed  URINALYSIS, ROUTINE W REFLEX MICROSCOPIC - Abnormal; Notable for the following:       Result Value   Hgb urine dipstick SMALL (*)    Protein, ur 100 (*)    All other components within normal limits  CBC - Abnormal; Notable for the following:    WBC 11.5 (*)    RBC 6.38 (*)    Hemoglobin 17.9 (*)    All other components within normal limits  COMPREHENSIVE METABOLIC PANEL    EKG  EKG Interpretation None       Radiology Ct Renal Stone Study  Result Date: 05/25/2016 CLINICAL DATA:  Left side groin pain beginning last night EXAM: CT ABDOMEN AND PELVIS WITHOUT CONTRAST TECHNIQUE: Multidetector CT imaging of the abdomen and pelvis was performed following the standard protocol without IV contrast. COMPARISON:  None. FINDINGS: Lower chest: Heart is normal size.  Lung bases clear.  No effusions. Hepatobiliary: No focal hepatic abnormality. Gallbladder unremarkable. Pancreas: No focal abnormality or ductal dilatation. Spleen: No focal abnormality.  Normal size. Adrenals/Urinary Tract: Indeterminate exophytic 2.4 cm lesion off the upper pole of the right kidney. This is not definitively cystic on this unenhanced study. This warrants additional evaluation. Second of  low-density area in the upper pole of the right kidney measures 15 mm and may represent a cyst but difficult to characterize on this unenhanced study. Small low-density area within the upper pole of the left kidney also difficult to characterize. No hydronephrosis. Adrenal glands and urinary bladder unremarkable. No renal or ureteral stones. No hydronephrosis. Stomach/Bowel: Appendix is normal. Stomach, large and small bowel grossly unremarkable. Vascular/Lymphatic: Saccular partially calcified aneurysm off the left side of the aorta measures up to 3.6 cm. Aneurysmal dilatation of the left common iliac artery measuring up to 3.1 cm. No adenopathy. Reproductive: No visible focal abnormality. Other: No free fluid or free air. Musculoskeletal: No acute bony abnormality or focal bone lesion. 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 Recommend vascular surgical consultation. Indeterminate exophytic lesion off the upper pole of the right kidney, 2.4 cm. This warrants additional workup with contrast-enhanced CT or  MRI. Other additional low-density areas in the kidneys may be cysts but recommend attention on additional imaging. Normal appendix. No renal or ureteral stones.  No hydronephrosis. Electronically Signed   By: Rolm Baptise M.D.   On: 05/25/2016 10:53   Ct Angio Abd/pel W/ And/or W/o  Result Date: 05/25/2016 CLINICAL DATA:  Presented to the ED with left groin pain. Abdominal aortic aneurysm. Renal lesion. EXAM: CTA ABDOMEN AND PELVIS WITH CONTRAST TECHNIQUE: Multidetector CT imaging of the abdomen and pelvis was performed using the standard protocol during bolus administration of intravenous contrast. Multiplanar reconstructed images and MIPs were obtained and reviewed to evaluate the vascular anatomy. CONTRAST:  100 mL Isovue 370 IV COMPARISON:  Earlier  noncontrast scan of the same day FINDINGS: VASCULAR Aorta: Visualized distal descending thoracic and suprarenal segments unremarkable. There is a saccular aneurysm from the left lateral aspect of the infrarenal aorta measuring up to 4 cm maximum transverse diameter, extending across the bifurcation into the common iliac artery. There is some eccentric mural thrombus as well as slow flow within the lesion. There is some curvilinear calcification in the wall of the aneurysmal segment. No dissection flap. No significant stenosis. No adjacent retroperitoneal hemorrhage or inflammatory change. Celiac: Patent without evidence of aneurysm, dissection, vasculitis or significant stenosis. SMA: Patent without evidence of aneurysm, dissection, vasculitis or significant stenosis. Replaced right hepatic arterial supply, an anatomic variant. Renals: Both renal arteries are patent without evidence of aneurysm, dissection, vasculitis, fibromuscular dysplasia or significant stenosis. IMA: Patent, arising from the saccular aneurysm. Inflow: Aneurysmal dilatation of the left common iliac artery, contiguous with the saccular distal aortic aneurysm, measured up to a 3.6 cm maximum transverse diameter, extending to the common iliac bifurcation. There is some eccentric mural thrombus in the aneurysmal segment. Mild inflammatory changes in the adjacent retroperitoneal fat. No dissection flap. No stenosis. Left internal and external iliac arteries are unremarkable. Right iliac arterial system has some scattered calcified plaque, the common iliac ectatic up to 15 mm. Proximal Outflow: Bilateral common femoral and visualized portions of the superficial and profunda femoral arteries are patent without evidence of aneurysm, dissection, vasculitis or significant stenosis. Veins: Patent iliac venous system, IVC, bilateral renal veins, hepatic veins, portal vein, SMV, splenic vein. Review of the MIP images confirms the above findings. NON-VASCULAR  Lower chest: No acute abnormality. Hepatobiliary: No focal liver abnormality is seen. No gallstones, gallbladder wall thickening, or biliary dilatation. Pancreas: Unremarkable. No pancreatic ductal dilatation or surrounding inflammatory changes. Spleen: Normal in size without focal abnormality. Adrenals/Urinary Tract: Normal adrenal glands. 2.7 cm enhancing partially exophytic upper pole right renal mass. Smaller bilateral renal cysts. No hydronephrosis. Urinary bladder is physiologically distended. Stomach/Bowel: Stomach is within normal limits. Appendix appears normal. No evidence of bowel wall thickening, distention, or inflammatory changes. Lymphatic: No adenopathy localized. Reproductive: Prostate is unremarkable. Other: No ascites.  No free air. Musculoskeletal: No acute or significant osseous findings. IMPRESSION: VASCULAR 1. 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 Findings may represent complications of infectious or autoimmune arteritis; atypical appearance for penetrating atheromatous ulcer or typical atherosclerotic change, with a paucity of disease elsewhere; no suggestion of dissection. The inflammatory changes around the common left common iliac artery along with regional pain suggest an acute exacerbation with elevated risk of rupture. Recommend vascular surgical consultation. NON-VASCULAR 1. 2.7 cm enhancing right upper pole renal mass suggesting renal cell carcinoma. Recommend urologic follow-up. Electronically Signed   By: Keturah Barre  Vernard Gambles M.D.   On: 05/25/2016 13:29    Procedures Procedures (including critical care time)  Medications Ordered in ED Medications  oxyCODONE-acetaminophen (PERCOCET/ROXICET) 5-325 MG per tablet 1 tablet (1 tablet Oral Given 05/25/16 1107)  labetalol (NORMODYNE,TRANDATE) injection 10 mg (10 mg Intravenous Given 05/25/16 1123)  iopamidol (ISOVUE-370) 76 % injection (100 mLs  Intravenous Contrast Given 05/25/16 1149)  labetalol (NORMODYNE,TRANDATE) injection 10 mg (10 mg Intravenous Given 05/25/16 1543)     Initial Impression / Assessment and Plan / ED Course  I have reviewed the triage vital signs and the nursing notes.  Pertinent labs & imaging results that were available during my care of the patient were reviewed by me and considered in my medical decision making (see chart for details).  Clinical Course   Patient is a 35 year old male presenting with left groin pain was last night at 10 PM. Hypertensive at 174/120. And in no acute distress. Heart and lung sounds clear. Abdomen soft and nontender. Negative Murphy sign. No focal tenderness at McBurney's point. No pulsatile mass noted. No hernia. Lab work shows slight increase in white blood cells, otherwise normal findings. CT without contrast shows no evidence of nephrolithiasis. 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 CTA also 2.7cm right upper pole mass on right kidney suggesting renal cell carcinoma.  Patient also seen and evaluated by Dr. Laverta Baltimore. Patient given labetalol in ED to help manage BP  Spoke with Vascular surgery, Dr. Scot Dock, who will see and evaluate patient.  Dr. Scot Dock would like patient sent to [facility] for management of blood pressure and repair of AAA.   I spoke with hospitalist, Dr. Sarajane Jews, who states he'll speak with Dr. Scot Dock to see whether or not patient needs to be admitted or not.   Final Clinical Impressions(s) / ED Diagnoses   Final diagnoses:  AAA (abdominal aortic aneurysm) [facility])    New Prescriptions New Prescriptions   No medications on file     [place], [place] 05/25/16 [place], MD 05/25/16 2006

## 2016-05-25 NOTE — ED Triage Notes (Signed)
Pt comes to ed, via c/o left side groin pain, started last night. Pain increased became more intense, so called ems.  V/s on arrival 204/120, pulse 84, rr13, pain score 2 out 10.  Pt comes from home, hx HTN.

## 2016-05-25 NOTE — ED Notes (Addendum)
Spoke with Dr. Scot Dock.  Plan of care is to send patient to [facility] under medical care to control patient BP.  After HTN is control patient will possible have surgery to repair abdominal aneurism

## 2016-05-25 NOTE — H&P (Addendum)
History and Physical  Cogan Vanzant [id number] DOB: 03-17-1981 DOA: 05/25/2016  PCP: No PCP Per Patient  Patient coming from: HOME  Chief Complaint: groin pain  HPI:  35 year old male presented to the emergency department with acute onset groin pain. 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 Vascular surgery consult, recommends transfer to [facility] for operative repair 12/29 and aggressive blood pressure control. Only comorbid condition is hypertension which is currently poorly controlled today but asymptomatic.  Patient reports no particular problems until developing sudden left groin pain last night at 10 PM. It has been constant since this time and fairly intense without aggravating or alleviating factors. Pain intensifies periodically. He denies any previous vascular problems. He reports blood pressures normally well controlled and a few data points available in the medical record confirm blood pressures normally well controlled. He is on monotherapy with lisinopril. No chest pain, shortness of breath or visual changes.  ED Course: Afebrile , SBP 170-180s. Diastolic blood pressure 0000000 Pertinent labs: CBC with modest elevation of WBC, 11.5. CMP unremarkable. Urinalysis unremarkable. Imaging: Aneurysm noted on CT as well as renal mass.  Review of Systems:  Negative for fever, visual changes, sore throat, rash, new muscle aches, chest pain, SOB, dysuria, bleeding, n/v/abdominal pain.  Past Medical History:  Diagnosis Date  . CHF (congestive heart failure) ([place])   . Hypertension     History reviewed. No pertinent surgical history. Denies previous surgery.  reports that he has quit smoking. His smoking use included Cigarettes. He smoked 1.00 pack per day. He does not have any smokeless tobacco history on file. He reports that he drinks alcohol. He reports that he does not use drugs. Mobility: Ambulatory.  No Known Allergies  Family History   Problem Relation Age of Onset  . Hypertension Mother   . Aneurysm Father     head     Prior to Admission medications   Medication Sig Start Date End Date Taking? Authorizing Provider  lisinopril (PRINIVIL,ZESTRIL) 20 MG tablet Take 20 mg by mouth daily.   Yes Historical Provider, MD  lisinopril-hydrochlorothiazide (PRINZIDE,ZESTORETIC) 20-25 MG per tablet Take 1 tablet by mouth daily. Patient not taking: Reported on 05/25/2016 01/22/15   Micheline Chapman, NP  oxyCODONE-acetaminophen (PERCOCET/ROXICET) 5-325 MG per tablet Take 1 tablet by mouth every 4 (four) hours as needed. Patient not taking: Reported on 05/25/2016 01/04/15   Larene Pickett, PA-C  predniSONE (DELTASONE) 20 MG tablet Take 2 tablets (40 mg total) by mouth daily. Take 40 mg by mouth daily for 3 days, then 20mg  by mouth daily for 3 days, then 10mg  daily for 3 days Patient not taking: Reported on 05/25/2016 01/04/15   Larene Pickett, PA-C    Physical Exam: Vitals:   05/25/16 0725 05/25/16 0941 05/25/16 1333 05/25/16 1538  BP: (!) 179/117 (!) 174/120 (!) 175/114 (!) 188/119  Pulse: 90 73 71 74  Resp: 18 16 16 12   Temp: 99.2 F (37.3 C)   99.2 F (37.3 C)  TempSrc: Oral   Oral  SpO2: 96% 99% 97% 98%    Constitutional:  . Appears calm and comfortable Eyes:  . Pupils and irises appear normal . Normal lids ENMT:  . grossly normal hearing . Lips appear normal Neck:  . neck appears normal, no masses, . no thyromegaly Respiratory:  . CTA bilaterally, no w/r/r.  . Respiratory effort normal.  Cardiovascular:  . RRR, no m/r/g . No LE extremity edema   Abdomen:  .  Abdomen appears normal; no tenderness or masses . No hernias Groin is nontender. No obvious masses. Overlying skin appears unremarkable. Musculoskeletal:  . RUE, LUE, RLE, LLE   o strength and tone grossly normal, no atrophy, no abnormal movements Skin:  . No rashes, lesions, ulcers . palpation of skin: no induration or nodules Psychiatric:   . judgement and insight appear normal . Mental status o Mood, affect appropriate  Wt Readings from Last 3 Encounters:  06/22/15 75.3 kg (166 lb)  01/22/15 78.5 kg (173 lb)  01/04/15 74.8 kg (165 lb)    I have personally reviewed following labs and imaging studies  Labs on Admission:  CBC:  Recent Labs Lab 05/25/16 1005  WBC 11.5*  HGB 17.9*  HCT 50.0  MCV 78.4  PLT 0000000   Basic Metabolic Panel:  Recent Labs Lab 05/25/16 1005  NA 137  K 3.5  CL 101  CO2 27  GLUCOSE 94  BUN 12  CREATININE 0.90  CALCIUM 8.9   Liver Function Tests:  Recent Labs Lab 05/25/16 1005  AST 18  ALT 20  ALKPHOS 82  BILITOT 0.8  PROT 7.3  ALBUMIN 3.8   Urine analysis:    Component Value Date/Time   COLORURINE YELLOW 05/25/2016 [place] 05/25/2016 0943   LABSPEC 1.009 05/25/2016 0943   PHURINE 6.0 05/25/2016 [place] 05/25/2016 0943   [place] (A) 05/25/2016 [place] NEGATIVE 05/25/2016 [place] 05/25/2016 0943   PROTEINUR 100 (A) 05/25/2016 0943   NITRITE NEGATIVE 05/25/2016 0943   LEUKOCYTESUR NEGATIVE 05/25/2016 0943   Radiological Exams on Admission: Ct Renal Stone Study  Result Date: 05/25/2016 CLINICAL DATA:  Left side groin pain beginning last night EXAM: CT ABDOMEN AND PELVIS WITHOUT CONTRAST TECHNIQUE: Multidetector CT imaging of the abdomen and pelvis was performed following the standard protocol without IV contrast. COMPARISON:  None. FINDINGS: Lower chest: Heart is normal size.  Lung bases clear.  No effusions. Hepatobiliary: No focal hepatic abnormality. Gallbladder unremarkable. Pancreas: No focal abnormality or ductal dilatation. Spleen: No focal abnormality.  Normal size. Adrenals/Urinary Tract: Indeterminate exophytic 2.4 cm lesion off the upper pole of the right kidney. This is not definitively cystic on this unenhanced study. This warrants additional evaluation. Second of low-density area in the  upper pole of the right kidney measures 15 mm and may represent a cyst but difficult to characterize on this unenhanced study. Small low-density area within the upper pole of the left kidney also difficult to characterize. No hydronephrosis. Adrenal glands and urinary bladder unremarkable. No renal or ureteral stones. No hydronephrosis. Stomach/Bowel: Appendix is normal. Stomach, large and small bowel grossly unremarkable. Vascular/Lymphatic: Saccular partially calcified aneurysm off the left side of the aorta measures up to 3.6 cm. Aneurysmal dilatation of the left common iliac artery measuring up to 3.1 cm. No adenopathy. Reproductive: No visible focal abnormality. Other: No free fluid or free air. Musculoskeletal: No acute bony abnormality or focal bone lesion. 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 Recommend vascular surgical consultation. Indeterminate exophytic lesion off the upper pole of the right kidney, 2.4 cm. This warrants additional workup with contrast-enhanced CT or MRI. Other additional low-density areas in the kidneys may be cysts but recommend attention on additional imaging. Normal appendix. No renal or ureteral stones.  No hydronephrosis. Electronically Signed  By: Rolm Baptise M.D.   On: 05/25/2016 10:53   Ct Angio Abd/pel W/ And/or W/o  Result Date: 05/25/2016 CLINICAL DATA:  Presented to the ED with left groin pain. Abdominal aortic aneurysm. Renal lesion. EXAM: CTA ABDOMEN AND PELVIS WITH CONTRAST TECHNIQUE: Multidetector CT imaging of the abdomen and pelvis was performed using the standard protocol during bolus administration of intravenous contrast. Multiplanar reconstructed images and MIPs were obtained and reviewed to evaluate the vascular anatomy. CONTRAST:  100 mL Isovue 370 IV COMPARISON:  Earlier noncontrast scan of the  same day FINDINGS: VASCULAR Aorta: Visualized distal descending thoracic and suprarenal segments unremarkable. There is a saccular aneurysm from the left lateral aspect of the infrarenal aorta measuring up to 4 cm maximum transverse diameter, extending across the bifurcation into the common iliac artery. There is some eccentric mural thrombus as well as slow flow within the lesion. There is some curvilinear calcification in the wall of the aneurysmal segment. No dissection flap. No significant stenosis. No adjacent retroperitoneal hemorrhage or inflammatory change. Celiac: Patent without evidence of aneurysm, dissection, vasculitis or significant stenosis. SMA: Patent without evidence of aneurysm, dissection, vasculitis or significant stenosis. Replaced right hepatic arterial supply, an anatomic variant. Renals: Both renal arteries are patent without evidence of aneurysm, dissection, vasculitis, fibromuscular dysplasia or significant stenosis. IMA: Patent, arising from the saccular aneurysm. Inflow: Aneurysmal dilatation of the left common iliac artery, contiguous with the saccular distal aortic aneurysm, measured up to a 3.6 cm maximum transverse diameter, extending to the common iliac bifurcation. There is some eccentric mural thrombus in the aneurysmal segment. Mild inflammatory changes in the adjacent retroperitoneal fat. No dissection flap. No stenosis. Left internal and external iliac arteries are unremarkable. Right iliac arterial system has some scattered calcified plaque, the common iliac ectatic up to 15 mm. Proximal Outflow: Bilateral common femoral and visualized portions of the superficial and profunda femoral arteries are patent without evidence of aneurysm, dissection, vasculitis or significant stenosis. Veins: Patent iliac venous system, IVC, bilateral renal veins, hepatic veins, portal vein, SMV, splenic vein. Review of the MIP images confirms the above findings. NON-VASCULAR Lower chest: No acute  abnormality. Hepatobiliary: No focal liver abnormality is seen. No gallstones, gallbladder wall thickening, or biliary dilatation. Pancreas: Unremarkable. No pancreatic ductal dilatation or surrounding inflammatory changes. Spleen: Normal in size without focal abnormality. Adrenals/Urinary Tract: Normal adrenal glands. 2.7 cm enhancing partially exophytic upper pole right renal mass. Smaller bilateral renal cysts. No hydronephrosis. Urinary bladder is physiologically distended. Stomach/Bowel: Stomach is within normal limits. Appendix appears normal. No evidence of bowel wall thickening, distention, or inflammatory changes. Lymphatic: No adenopathy localized. Reproductive: Prostate is unremarkable. Other: No ascites.  No free air. Musculoskeletal: No acute or significant osseous findings. IMPRESSION: VASCULAR 1. 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 Findings may represent complications of infectious or autoimmune arteritis; atypical appearance for penetrating atheromatous ulcer or typical atherosclerotic change, with a paucity of disease elsewhere; no suggestion of dissection. The inflammatory changes around the common left common iliac artery along with regional pain suggest an acute exacerbation with elevated risk of rupture. Recommend vascular surgical consultation. NON-VASCULAR 1. 2.7 cm enhancing right upper pole renal mass suggesting renal cell carcinoma. Recommend urologic follow-up. Electronically Signed   By: Lucrezia Europe M.D.   On: 05/25/2016 13:29     Principal Problem:   Saccular aneurysm Active Problems:   Accelerated hypertension   Renal mass   Assessment/Plan  1. Saccular aneurysm of the infrarenal aorta with fusiform left common iliac artery aneurysm, unusual in appearance with inflammatory changes suggesting acute exacerbation.  Discussed with vascular surgery. Plan for endovascular repair 12/29.  Requesting transfer to [place] with this and tied blood pressure control. 2. Accelerated hypertension  Asymptomatic. No evidence of end organ dysfunction.  Aggressive blood pressure control recommended by vascular surgery (I discussed with Dr. Scot Dock) given #1. 3. 2.7 cm enhancing renal mass worrisome for renal cell carcinoma.  Will need follow-up with urology. This has not yet been discussed with the patient.   Patient appears medically stable. Per discussion with Dr. Scot Dock, will transfer to [facility] with plans for operative repair tomorrow. In the meantime aggressive blood pressure control has been recommended. We'll start with IV push labetalol. May require infusion of blood pressure does not respond.  DVT prophylaxis:SCDs Code Status: full code Family Communication: none Consults called: VVS    Time spent: 60 minutes  Murray Hodgkins, MD  [facility] Direct contact: [phone] --[place]  --[email or web address]; password [email or web address]  7PM-7AM contact night coverage as above  05/25/2016, 5:10 PM

## 2016-05-25 NOTE — Consult Note (Addendum)
Patient name: Nicholas Larson MRN: [id number] DOB: 1980/09/20 Sex: male  REASON FOR CONSULT: Saccular abdominal aortic aneurysm and left common iliac artery aneurysm. Consult is from the [facility] emergency department.  HPI: Nicholas Larson is a 35 y.o. male, who awoke at 70 PM last night with fairly severe left groin pain. This pain has been persistent. He did receive some Percocet in the emergency department which has helped. The pain is localized to the groin. He denies abdominal pain or back pain. He denies any chest pain. He had a CT scan without contrast to look for a kidney stone and an incidental finding was a 3.6 cm infrarenal aneurysm and aneurysm of the left common iliac artery. For this reason vascular surgery was consulted.   The patient does not have any family history of abdominal aortic aneurysms. His father had a brain aneurysm. He is a smoker. He smokes half a pack per day and has been smoking for over 15 years. He also has fairly poorly controlled blood pressure. He does not have a primary care physician.  His pain has improved since he has received pain medicine by mouth.   Past Medical History:  Diagnosis Date  . CHF (congestive heart failure) ([place])   . Hypertension     No family history on file.  There is no family history of premature cardiovascular disease. There is no family history of abdominal aortic aneurysm.   SOCIAL HISTORY: He is single. He does not have any children.  Social History   Social History  . Marital status: Married    Spouse name: N/A  . Number of children: N/A  . Years of education: N/A   Occupational History  . Not on file.   Social History Main Topics  . Smoking status: Current Every Day Smoker    Packs/day: 1.00    Types: Cigarettes  . Smokeless tobacco: Not on file  . Alcohol use Yes     Comment: daily "when I can"  . Drug use: No  . Sexual activity: Not on file   Other Topics Concern  . Not on file   Social History  Narrative  . No narrative on file    No Known Allergies  Current Facility-Administered Medications  Medication Dose Route Frequency Provider Last Rate Last Dose  . labetalol (NORMODYNE,TRANDATE) injection 10 mg  10 mg Intravenous Once Margette Fast, MD       Current Outpatient Prescriptions  Medication Sig Dispense Refill  . lisinopril (PRINIVIL,ZESTRIL) 20 MG tablet Take 20 mg by mouth daily.    Marland Kitchen lisinopril-hydrochlorothiazide (PRINZIDE,ZESTORETIC) 20-25 MG per tablet Take 1 tablet by mouth daily. (Patient not taking: Reported on 05/25/2016) 90 tablet 3  . oxyCODONE-acetaminophen (PERCOCET/ROXICET) 5-325 MG per tablet Take 1 tablet by mouth every 4 (four) hours as needed. (Patient not taking: Reported on 05/25/2016) 15 tablet 0  . predniSONE (DELTASONE) 20 MG tablet Take 2 tablets (40 mg total) by mouth daily. Take 40 mg by mouth daily for 3 days, then 20mg  by mouth daily for 3 days, then 10mg  daily for 3 days (Patient not taking: Reported on 05/25/2016) 12 tablet 0    REVIEW OF SYSTEMS:  [X]  denotes positive finding, [ ]  denotes negative finding Cardiac  Comments:  Chest pain or chest pressure:    Shortness of breath upon exertion:    Short of breath when lying flat:    Irregular heart rhythm:        Vascular    Pain  in calf, thigh, or hip brought on by ambulation:    Pain in feet at night that wakes you up from your sleep:     Blood clot in your veins:    Leg swelling:         Pulmonary    Oxygen at home:    Productive cough:     Wheezing:         Neurologic    Sudden weakness in arms or legs:     Sudden numbness in arms or legs:     Sudden onset of difficulty speaking or slurred speech:    Temporary loss of vision in one eye:     Problems with dizziness:         Gastrointestinal    Blood in stool:     Vomited blood:         Genitourinary    Burning when urinating:     Blood in urine:        Psychiatric    Major depression:         Hematologic    Bleeding  problems:    Problems with blood clotting too easily:        Skin    Rashes or ulcers:        Constitutional    Fever or chills:      PHYSICAL EXAM: Vitals:   05/25/16 0725 05/25/16 0941 05/25/16 1333  BP: (!) 179/117 (!) 174/120 (!) 175/114  Pulse: 90 73 71  Resp: 18 16 16   Temp: 99.2 F (37.3 C)    TempSrc: Oral    SpO2: 96% 99% 97%   '  GENERAL: The patient is a well-nourished male, in no acute distress. The vital signs are documented above. CARDIAC: There is a regular rate and rhythm.  VASCULAR: I do not detect carotid bruits. He has palpable femoral, popliteal, and pedal pulses. PULMONARY: There is good air exchange bilaterally without wheezing or rales. ABDOMEN: Soft and non-tender with normal pitched bowel sounds. He is morbidly obese and I cannot palpate his aneurysm. MUSCULOSKELETAL: There are no major deformities or cyanosis. NEUROLOGIC: No focal weakness or paresthesias are detected. SKIN: There are no ulcers or rashes noted. PSYCHIATRIC: The patient has a normal affect.  DATA:   CT ANGIO: I have reviewed the CT angiogram today. He has an unusual saccular aneurysm on the left lateral aspect of the infrarenal aorta which measures up proximally 4 cm in maximum diameter. This extends down into the left common iliac artery. There is no evidence of dissection.  There are some inflammatory changes around the left common iliac artery.   GFR is greater than 60. Creatinine is 0.9. His white count is 11.5.  ECHO: ECHo on 05/15/16 shows LVEF: 60-65%. Moderate LVH. Grade 2 diastolic dysfunction.   MEDICAL ISSUES:  SACCULAR ABDOMINAL AORTIC ANEURYSM AND LEFT COMMON ILIAC ARTERY ANEURYSM: Given the inflammation around the left common iliac artery, it is possible that his left groin pain could be related to his aneurysm. The aneurysm itself is very unusual and is saccular. He has not had any fever or chills. He denies any abdominal or back pain. Given that the aneurysm is  saccular with acute symptoms possibly related to the aneurysm, I would recommend repair. His blood pressure is very poorly controlled and he will be admitted by the medical service for control of his blood pressure. I plan on proceeding with endovascular aneurysm repair tomorrow morning. His renal function is normal. He is a  poor candidate for open repair given his morbid obesity.  I have discussed the indications for endovascular repair and the potential complications of surgery. I also explained the need for long-term follow up. We will need to coil embolize his left hypogastric artery at the time of surgery and I have explained this to him. His right hypogastric artery is widely patent.   [name] [facility] [phone]

## 2016-05-25 NOTE — ED Notes (Signed)
Bed: WA17 Expected date:  Expected time:  Means of arrival:  Comments: EMS- 35yo M, groin pain

## 2016-05-26 ENCOUNTER — Inpatient Hospital Stay (HOSPITAL_COMMUNITY): Payer: BLUE CROSS/BLUE SHIELD | Admitting: Anesthesiology

## 2016-05-26 ENCOUNTER — Encounter (HOSPITAL_COMMUNITY)
Admission: EM | Disposition: A | Discharge status: Home / Self Care | Payer: Self-pay | Source: Home / Self Care | Attending: Internal Medicine

## 2016-05-26 ENCOUNTER — Inpatient Hospital Stay (HOSPITAL_COMMUNITY): Payer: BLUE CROSS/BLUE SHIELD

## 2016-05-26 DIAGNOSIS — I10 Essential (primary) hypertension: Secondary | ICD-10-CM

## 2016-05-26 DIAGNOSIS — I671 Cerebral aneurysm, nonruptured: Secondary | ICD-10-CM

## 2016-05-26 DIAGNOSIS — I714 Abdominal aortic aneurysm, without rupture, unspecified: Secondary | ICD-10-CM

## 2016-05-26 DIAGNOSIS — N2889 Other specified disorders of kidney and ureter: Secondary | ICD-10-CM

## 2016-05-26 HISTORY — PX: EMBOLIZATION: SHX5507

## 2016-05-26 HISTORY — PX: ABDOMINAL AORTIC ENDOVASCULAR STENT GRAFT: SHX5707

## 2016-05-26 LAB — TYPE AND SCREEN
ABO/RH(D): A POS
ANTIBODY SCREEN: NEGATIVE
Unit division: 0
Unit division: 0

## 2016-05-26 LAB — PREPARE RBC (CROSSMATCH)

## 2016-05-26 LAB — CBC
HCT: 44.5 % (ref 39.0–52.0)
Hemoglobin: 15.8 g/dL (ref 13.0–17.0)
MCH: 28.1 pg (ref 26.0–34.0)
MCHC: 35.5 g/dL (ref 30.0–36.0)
MCV: 79 fL (ref 78.0–100.0)
PLATELETS: 172 10*3/uL (ref 150–400)
RBC: 5.63 MIL/uL (ref 4.22–5.81)
RDW: 13.8 % (ref 11.5–15.5)
WBC: 9.8 10*3/uL (ref 4.0–10.5)

## 2016-05-26 LAB — BASIC METABOLIC PANEL
Anion gap: 12 (ref 5–15)
BUN: 10 mg/dL (ref 6–20)
CO2: 23 mmol/L (ref 22–32)
CREATININE: 1.21 mg/dL (ref 0.61–1.24)
Calcium: 8.6 mg/dL — ABNORMAL LOW (ref 8.9–10.3)
Chloride: 102 mmol/L (ref 101–111)
GFR calc Af Amer: >60 mL/min (ref >60)
Glucose, Bld: 122 mg/dL — ABNORMAL HIGH (ref 65–99)
Potassium: 3.8 mmol/L (ref 3.5–5.1)
SODIUM: 137 mmol/L (ref 135–145)

## 2016-05-26 LAB — APTT: APTT: 29 s (ref 24–36)

## 2016-05-26 LAB — PROTIME-INR
INR: 0.93
PROTHROMBIN TIME: 12.5 s (ref 11.4–15.2)

## 2016-05-26 LAB — MAGNESIUM: Magnesium: 1.7 mg/dL (ref 1.7–2.4)

## 2016-05-26 LAB — ABO/RH: ABO/RH(D): A POS

## 2016-05-26 SURGERY — INSERTION, ENDOVASCULAR STENT GRAFT, AORTA, ABDOMINAL
Anesthesia: General | Site: Groin

## 2016-05-26 MED ORDER — LACTATED RINGERS IV SOLN
INTRAVENOUS | Status: DC | PRN
  Administered 2016-05-26 (×2): via INTRAVENOUS

## 2016-05-26 MED ORDER — NICARDIPINE HCL 2.5 MG/ML IV SOLN
5000.0000 ug | Freq: Once | INTRAVENOUS | Status: AC
  Administered 2016-05-26: 5000 ug via INTRAVENOUS

## 2016-05-26 MED ORDER — PROTAMINE SULFATE 10 MG/ML IV SOLN
INTRAVENOUS | Status: AC
  Filled 2016-05-26: qty 5

## 2016-05-26 MED ORDER — METOPROLOL TARTRATE 5 MG/5ML IV SOLN
2.5000 mg | INTRAVENOUS | Status: DC | PRN
  Administered 2016-05-26: 5 mg via INTRAVENOUS

## 2016-05-26 MED ORDER — HYDROMORPHONE HCL 1 MG/ML IJ SOLN: 0.2500 mg | INTRAMUSCULAR | Status: DC | PRN

## 2016-05-26 MED ORDER — PROPOFOL 10 MG/ML IV BOLUS
INTRAVENOUS | Status: AC
  Filled 2016-05-26: qty 20

## 2016-05-26 MED ORDER — ONDANSETRON HCL 4 MG/2ML IJ SOLN
INTRAMUSCULAR | Status: DC | PRN
  Administered 2016-05-26: 4 mg via INTRAVENOUS

## 2016-05-26 MED ORDER — CEFAZOLIN SODIUM 1 G IJ SOLR
INTRAMUSCULAR | Status: AC
  Filled 2016-05-26: qty 20

## 2016-05-26 MED ORDER — FENTANYL CITRATE (PF) 250 MCG/5ML IJ SOLN
INTRAMUSCULAR | Status: AC
  Filled 2016-05-26: qty 5

## 2016-05-26 MED ORDER — SUCCINYLCHOLINE CHLORIDE 20 MG/ML IJ SOLN
INTRAMUSCULAR | Status: DC | PRN
  Administered 2016-05-26: 120 mg via INTRAVENOUS

## 2016-05-26 MED ORDER — NICARDIPINE HCL 2.5 MG/ML IV SOLN
5000.0000 ug | Freq: Once | INTRAVENOUS | Status: AC
  Administered 2016-05-26: 5000 ug via INTRAVENOUS
  Filled 2016-05-26: qty 2

## 2016-05-26 MED ORDER — PANTOPRAZOLE SODIUM 40 MG PO TBEC
40.0000 mg | DELAYED_RELEASE_TABLET | Freq: Every day | ORAL | Status: DC
  Administered 2016-05-26 – 2016-05-28 (×3): 40 mg via ORAL
  Filled 2016-05-26 (×3): qty 1

## 2016-05-26 MED ORDER — HEPARIN SODIUM (PORCINE) 1000 UNIT/ML IJ SOLN
INTRAMUSCULAR | Status: DC | PRN
  Administered 2016-05-26: 8 mL via INTRAVENOUS

## 2016-05-26 MED ORDER — SUCCINYLCHOLINE CHLORIDE 200 MG/10ML IV SOSY
PREFILLED_SYRINGE | INTRAVENOUS | Status: AC
  Filled 2016-05-26: qty 10

## 2016-05-26 MED ORDER — LIDOCAINE 2% (20 MG/ML) 5 ML SYRINGE
INTRAMUSCULAR | Status: AC
  Filled 2016-05-26: qty 5

## 2016-05-26 MED ORDER — ALBUMIN HUMAN 5 % IV SOLN
INTRAVENOUS | Status: DC | PRN
  Administered 2016-05-26: 09:00:00 via INTRAVENOUS

## 2016-05-26 MED ORDER — HEPARIN SODIUM (PORCINE) 1000 UNIT/ML IJ SOLN
INTRAMUSCULAR | Status: AC
  Filled 2016-05-26: qty 1

## 2016-05-26 MED ORDER — SODIUM CHLORIDE 0.9 % IV SOLN: 10.0000 mL/h | Freq: Once | INTRAVENOUS | Status: DC

## 2016-05-26 MED ORDER — ALBUTEROL SULFATE (2.5 MG/3ML) 0.083% IN NEBU
2.5000 mg | INHALATION_SOLUTION | Freq: Once | RESPIRATORY_TRACT | Status: AC
  Administered 2016-05-26: 2.5 mg via RESPIRATORY_TRACT

## 2016-05-26 MED ORDER — NICARDIPINE HCL IN NACL 20-0.86 MG/200ML-% IV SOLN
3.0000 mg/h | INTRAVENOUS | Status: DC
  Administered 2016-05-26: 8.5 mg/h via INTRAVENOUS
  Administered 2016-05-26: 5 mg/h via INTRAVENOUS
  Administered 2016-05-27: 3 mg/h via INTRAVENOUS
  Administered 2016-05-27 (×2): 8.5 mg/h via INTRAVENOUS
  Administered 2016-05-27: 6 mg/h via INTRAVENOUS
  Administered 2016-05-27: 8.5 mg/h via INTRAVENOUS
  Filled 2016-05-26 (×11): qty 200

## 2016-05-26 MED ORDER — METOPROLOL TARTRATE 5 MG/5ML IV SOLN
INTRAVENOUS | Status: DC | PRN
  Administered 2016-05-26 (×2): 2.5 mg via INTRAVENOUS

## 2016-05-26 MED ORDER — ALBUTEROL SULFATE (2.5 MG/3ML) 0.083% IN NEBU
INHALATION_SOLUTION | RESPIRATORY_TRACT | Status: AC
  Administered 2016-05-26: 2.5 mg via RESPIRATORY_TRACT
  Filled 2016-05-26: qty 3

## 2016-05-26 MED ORDER — FENTANYL CITRATE (PF) 100 MCG/2ML IJ SOLN
INTRAMUSCULAR | Status: DC | PRN
  Administered 2016-05-26: 150 ug via INTRAVENOUS
  Administered 2016-05-26 (×2): 50 ug via INTRAVENOUS

## 2016-05-26 MED ORDER — SODIUM CHLORIDE 0.9 % IV SOLN
INTRAVENOUS | Status: DC
  Administered 2016-05-26: 50 mL/h via INTRAVENOUS

## 2016-05-26 MED ORDER — DEXAMETHASONE SODIUM PHOSPHATE 10 MG/ML IJ SOLN
INTRAMUSCULAR | Status: AC
  Filled 2016-05-26: qty 1

## 2016-05-26 MED ORDER — NICARDIPINE HCL IN NACL 20-0.86 MG/200ML-% IV SOLN
3.0000 mg/h | INTRAVENOUS | Status: DC
  Administered 2016-05-26 (×2): 8.5 mg/h via INTRAVENOUS
  Filled 2016-05-26: qty 200

## 2016-05-26 MED ORDER — POTASSIUM CHLORIDE CRYS ER 20 MEQ PO TBCR: 20.0000 meq | EXTENDED_RELEASE_TABLET | Freq: Every day | ORAL | Status: DC | PRN

## 2016-05-26 MED ORDER — METOPROLOL TARTRATE 5 MG/5ML IV SOLN
INTRAVENOUS | Status: AC
  Administered 2016-05-26: 5 mg via INTRAVENOUS
  Filled 2016-05-26: qty 5

## 2016-05-26 MED ORDER — SODIUM CHLORIDE 0.9 % IV SOLN: 500.0000 mL | Freq: Once | INTRAVENOUS | Status: DC | PRN

## 2016-05-26 MED ORDER — ENOXAPARIN SODIUM 40 MG/0.4ML ~~LOC~~ SOLN
40.0000 mg | SUBCUTANEOUS | Status: DC
  Administered 2016-05-27: 40 mg via SUBCUTANEOUS
  Filled 2016-05-26: qty 0.4

## 2016-05-26 MED ORDER — CEFAZOLIN SODIUM-DEXTROSE 2-4 GM/100ML-% IV SOLN
2.0000 g | INTRAVENOUS | Status: AC
  Administered 2016-05-26: 2 g via INTRAVENOUS
  Filled 2016-05-26: qty 100

## 2016-05-26 MED ORDER — PROTAMINE SULFATE 10 MG/ML IV SOLN
INTRAVENOUS | Status: DC | PRN
  Administered 2016-05-26: 40 mg via INTRAVENOUS

## 2016-05-26 MED ORDER — PHENYLEPHRINE HCL 10 MG/ML IJ SOLN
INTRAVENOUS | Status: DC | PRN
  Administered 2016-05-26: 25 ug/min via INTRAVENOUS

## 2016-05-26 MED ORDER — MIDAZOLAM HCL 2 MG/2ML IJ SOLN
INTRAMUSCULAR | Status: AC
  Filled 2016-05-26: qty 2

## 2016-05-26 MED ORDER — DEXAMETHASONE SODIUM PHOSPHATE 10 MG/ML IJ SOLN
INTRAMUSCULAR | Status: DC | PRN
  Administered 2016-05-26: 10 mg via INTRAVENOUS

## 2016-05-26 MED ORDER — LABETALOL HCL 5 MG/ML IV SOLN
0.5000 mg/min | INTRAVENOUS | Status: DC
  Administered 2016-05-26: 0.5 mg/min via INTRAVENOUS
  Administered 2016-05-27 (×2): 2 mg/min via INTRAVENOUS
  Administered 2016-05-27: 1 mg/min via INTRAVENOUS
  Filled 2016-05-26: qty 100
  Filled 2016-05-26: qty 200
  Filled 2016-05-26: qty 100
  Filled 2016-05-26: qty 200
  Filled 2016-05-26: qty 100

## 2016-05-26 MED ORDER — DOCUSATE SODIUM 100 MG PO CAPS
100.0000 mg | ORAL_CAPSULE | Freq: Every day | ORAL | Status: DC
  Administered 2016-05-27 – 2016-05-28 (×2): 100 mg via ORAL
  Filled 2016-05-26 (×2): qty 1

## 2016-05-26 MED ORDER — PROPOFOL 10 MG/ML IV BOLUS
INTRAVENOUS | Status: DC | PRN
  Administered 2016-05-26 (×2): 50 mg via INTRAVENOUS
  Administered 2016-05-26: 150 mg via INTRAVENOUS

## 2016-05-26 MED ORDER — IODIXANOL 320 MG/ML IV SOLN
INTRAVENOUS | Status: DC | PRN
  Administered 2016-05-26: 40.6 mL via INTRAVENOUS
  Administered 2016-05-26: 60 mL via INTRAVENOUS

## 2016-05-26 MED ORDER — SODIUM CHLORIDE 0.9 % IV SOLN
INTRAVENOUS | Status: DC | PRN
  Administered 2016-05-26: 07:00:00 via INTRAVENOUS

## 2016-05-26 MED ORDER — ALUM & MAG HYDROXIDE-SIMETH 200-200-20 MG/5ML PO SUSP: 15.0000 mL | ORAL | Status: DC | PRN

## 2016-05-26 MED ORDER — ALBUTEROL SULFATE HFA 108 (90 BASE) MCG/ACT IN AERS
INHALATION_SPRAY | RESPIRATORY_TRACT | Status: DC | PRN
  Administered 2016-05-26: 5 via RESPIRATORY_TRACT

## 2016-05-26 MED ORDER — MAGNESIUM SULFATE 2 GM/50ML IV SOLN: 2.0000 g | Freq: Every day | INTRAVENOUS | Status: DC | PRN

## 2016-05-26 MED ORDER — METOPROLOL TARTRATE 5 MG/5ML IV SOLN
5.0000 mg | Freq: Once | INTRAVENOUS | Status: AC
  Administered 2016-05-26: 5 mg via INTRAVENOUS
  Filled 2016-05-26: qty 5

## 2016-05-26 MED ORDER — ONDANSETRON HCL 4 MG/2ML IJ SOLN
INTRAMUSCULAR | Status: AC
  Filled 2016-05-26: qty 2

## 2016-05-26 MED ORDER — MIDAZOLAM HCL 5 MG/5ML IJ SOLN
INTRAMUSCULAR | Status: DC | PRN
  Administered 2016-05-26: 2 mg via INTRAVENOUS

## 2016-05-26 MED ORDER — GUAIFENESIN-DM 100-10 MG/5ML PO SYRP: 15.0000 mL | ORAL_SOLUTION | ORAL | Status: DC | PRN

## 2016-05-26 MED ORDER — PHENOL 1.4 % MT LIQD: 1.0000 | OROMUCOSAL | Status: DC | PRN

## 2016-05-26 MED ORDER — SODIUM CHLORIDE 0.9 % IV SOLN
INTRAVENOUS | Status: DC | PRN
  Administered 2016-05-26 (×2): 500 mL

## 2016-05-26 MED ORDER — HYDRALAZINE HCL 20 MG/ML IJ SOLN: 10.0000 mg | INTRAMUSCULAR | Status: DC | PRN

## 2016-05-26 MED ORDER — PHENYLEPHRINE HCL 10 MG/ML IJ SOLN
INTRAMUSCULAR | Status: DC | PRN
  Administered 2016-05-26: 80 ug via INTRAVENOUS

## 2016-05-26 MED ORDER — BISACODYL 10 MG RE SUPP: 10.0000 mg | Freq: Every day | RECTAL | Status: DC | PRN

## 2016-05-26 MED ORDER — IODIXANOL 320 MG/ML IV SOLN
INTRAVENOUS | Status: DC | PRN
  Administered 2016-05-26: 50 mL via INTRAVENOUS

## 2016-05-26 MED ORDER — MORPHINE SULFATE (PF) 2 MG/ML IV SOLN
2.0000 mg | INTRAVENOUS | Status: DC | PRN
  Administered 2016-05-26 – 2016-05-27 (×2): 2 mg via INTRAVENOUS
  Filled 2016-05-26 (×2): qty 1

## 2016-05-26 MED ORDER — DEXTROSE 5 % IV SOLN
1.5000 g | Freq: Two times a day (BID) | INTRAVENOUS | Status: AC
  Administered 2016-05-26 – 2016-05-27 (×2): 1.5 g via INTRAVENOUS
  Filled 2016-05-26 (×2): qty 1.5

## 2016-05-26 MED ORDER — ORAL CARE MOUTH RINSE
15.0000 mL | Freq: Two times a day (BID) | OROMUCOSAL | Status: DC
  Administered 2016-05-26 – 2016-05-27 (×2): 15 mL via OROMUCOSAL

## 2016-05-26 MED ORDER — PHENYLEPHRINE 40 MCG/ML (10ML) SYRINGE FOR IV PUSH (FOR BLOOD PRESSURE SUPPORT)
PREFILLED_SYRINGE | INTRAVENOUS | Status: AC
  Filled 2016-05-26: qty 10

## 2016-05-26 MED ORDER — LIDOCAINE 2% (20 MG/ML) 5 ML SYRINGE
INTRAMUSCULAR | Status: DC | PRN
  Administered 2016-05-26: 100 mg via INTRAVENOUS

## 2016-05-26 MED ORDER — 0.9 % SODIUM CHLORIDE (POUR BTL) OPTIME
TOPICAL | Status: DC | PRN
  Administered 2016-05-26: 1000 mL

## 2016-05-26 MED ORDER — METOPROLOL TARTRATE 5 MG/5ML IV SOLN
7.5000 mg | Freq: Four times a day (QID) | INTRAVENOUS | Status: DC
  Administered 2016-05-26: 7.5 mg via INTRAVENOUS
  Filled 2016-05-26: qty 10

## 2016-05-26 MED ORDER — LABETALOL HCL 5 MG/ML IV SOLN: 10.0000 mg | INTRAVENOUS | Status: DC | PRN

## 2016-05-26 SURGICAL SUPPLY — 62 items
BLADE SURG CLIPPER 3M 9600 (MISCELLANEOUS) ×4 IMPLANT
CANISTER SUCTION 2500CC (MISCELLANEOUS) ×4 IMPLANT
CATH ACCU-VU SIZ PIG 5F 70CM (CATHETERS) IMPLANT
CATH ANGIO 5F BER2 65CM (CATHETERS) ×4 IMPLANT
CATH BEACON 5.038 65CM KMP-01 (CATHETERS) IMPLANT
CATH OMNI FLUSH .035X70CM (CATHETERS) ×4 IMPLANT
CATH STRAIGHT 5FR 65CM (CATHETERS) ×4 IMPLANT
COIL NESTER 14X8 (Vascular Products) ×20 IMPLANT
COVER PROBE W GEL 5X96 (DRAPES) IMPLANT
DERMABOND ADHESIVE PROPEN (GAUZE/BANDAGES/DRESSINGS) ×4
DERMABOND ADVANCED (GAUZE/BANDAGES/DRESSINGS) ×2
DERMABOND ADVANCED .7 DNX12 (GAUZE/BANDAGES/DRESSINGS) ×2 IMPLANT
DERMABOND ADVANCED .7 DNX6 (GAUZE/BANDAGES/DRESSINGS) ×4 IMPLANT
DEVICE CLOSURE PERCLS PRGLD 6F (VASCULAR PRODUCTS) ×10 IMPLANT
DEVICE TORQUE KENDALL .025-038 (MISCELLANEOUS) ×4 IMPLANT
DRSG TEGADERM 2-3/8X2-3/4 SM (GAUZE/BANDAGES/DRESSINGS) ×8 IMPLANT
DRYSEAL FLEXSHEATH 12FR 33CM (SHEATH) ×2
DRYSEAL FLEXSHEATH 16FR 33CM (SHEATH) ×2
ELECT REM PT RETURN 9FT ADLT (ELECTROSURGICAL) ×8
ELECTRODE REM PT RTRN 9FT ADLT (ELECTROSURGICAL) ×4 IMPLANT
EXCLUDER TNK LEG 23MX12X16 (Endovascular Graft) ×2 IMPLANT
EXCLUDER TRUNK LEG 23MX12X16 (Endovascular Graft) ×4 IMPLANT
EXTENDER ENDOPROSTHESIS 10X7 (Endovascular Graft) ×4 IMPLANT
GAUZE SPONGE 2X2 8PLY STRL LF (GAUZE/BANDAGES/DRESSINGS) ×4 IMPLANT
GAUZE SPONGE 4X4 12PLY STRL (GAUZE/BANDAGES/DRESSINGS) ×4 IMPLANT
GAUZE SPONGE 4X4 16PLY XRAY LF (GAUZE/BANDAGES/DRESSINGS) ×4 IMPLANT
GLOVE BIO SURGEON STRL SZ 6.5 (GLOVE) ×3 IMPLANT
GLOVE BIO SURGEON STRL SZ7.5 (GLOVE) ×4 IMPLANT
GLOVE BIO SURGEONS STRL SZ 6.5 (GLOVE) ×1
GLOVE BIOGEL PI IND STRL 6.5 (GLOVE) ×2 IMPLANT
GLOVE BIOGEL PI IND STRL 8 (GLOVE) ×2 IMPLANT
GLOVE BIOGEL PI INDICATOR 6.5 (GLOVE) ×2
GLOVE BIOGEL PI INDICATOR 8 (GLOVE) ×2
GOWN STRL REUS W/ TWL LRG LVL3 (GOWN DISPOSABLE) ×6 IMPLANT
GOWN STRL REUS W/TWL LRG LVL3 (GOWN DISPOSABLE) ×6
GRAFT BALLN CATH 65CM (STENTS) ×2 IMPLANT
GUIDEWIRE ANGLED .035X150CM (WIRE) ×4 IMPLANT
KIT BASIN OR (CUSTOM PROCEDURE TRAY) ×4 IMPLANT
KIT ROOM TURNOVER OR (KITS) ×4 IMPLANT
LEG CONTRALATERAL 16X18X9.5 (Endovascular Graft) ×4 IMPLANT
NEEDLE PERC 18GX7CM (NEEDLE) ×4 IMPLANT
NS IRRIG 1000ML POUR BTL (IV SOLUTION) ×4 IMPLANT
PACK ENDOVASCULAR (PACKS) ×4 IMPLANT
PAD ARMBOARD 7.5X6 YLW CONV (MISCELLANEOUS) ×8 IMPLANT
PERCLOSE PROGLIDE 6F (VASCULAR PRODUCTS) ×20
SHEATH AVANTI 11CM 8FR (MISCELLANEOUS) ×4 IMPLANT
SHEATH BRITE TIP 8FR 23CM (MISCELLANEOUS) ×4 IMPLANT
SHEATH DRYSEAL FLEX 12FR 33CM (SHEATH) ×2 IMPLANT
SHEATH DRYSEAL FLEX 16FR 33CM (SHEATH) ×2 IMPLANT
SHEATH FAST CATH 10F 12CM (SHEATH) ×4 IMPLANT
SHEATH PINNACLE ST 7F 45CM (SHEATH) ×4 IMPLANT
SPONGE GAUZE 2X2 STER 10/PKG (GAUZE/BANDAGES/DRESSINGS) ×4
STAPLER VISISTAT (STAPLE) IMPLANT
STENT GRAFT BALLN CATH 65CM (STENTS) ×2
STOPCOCK MORSE 400PSI 3WAY (MISCELLANEOUS) ×8 IMPLANT
SUT PROLENE 5 0 C 1 24 (SUTURE) IMPLANT
SUT VICRYL 4-0 PS2 18IN ABS (SUTURE) ×8 IMPLANT
SYR 30ML LL (SYRINGE) ×4 IMPLANT
TRAY FOLEY W/METER SILVER 16FR (SET/KITS/TRAYS/PACK) ×4 IMPLANT
TUBING HIGH PRESSURE 120CM (CONNECTOR) ×4 IMPLANT
WIRE AMPLATZ SS-J .035X180CM (WIRE) ×8 IMPLANT
WIRE BENTSON .035X145CM (WIRE) ×12 IMPLANT

## 2016-05-26 NOTE — Progress Notes (Addendum)
PROGRESS NOTE    Nicholas Larson  [id number] DOB: [birth year] DOA: 05/25/2016 PCP: No PCP Per Patient   Brief Narrative:  35 yo BM PMHx HTN, Chronic Diastolic CHF,  Presented to the emergency department with acute onset groin pain. 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 Vascular surgery consult, recommends transfer to [facility] for operative repair 12/29 and aggressive blood pressure control. Only comorbid condition is hypertension which is currently poorly controlled today but asymptomatic.  Patient reports no particular problems until developing sudden left groin pain last night at 10 PM. It has been constant since this time and fairly intense without aggravating or alleviating factors. Pain intensifies periodically. He denies any previous vascular problems. He reports blood pressures normally well controlled and a few data points available in the medical record confirm blood pressures normally well controlled. He is on monotherapy with lisinopril. No chest pain, shortness of breath or visual changes.    Subjective: 12/29 A/O 4, NAD. Concern is when he'll be allowed to eat.    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  Chronic Diastolic CHF -Strict I and O -Daily weight -Cardene drip -Goal HR <100: Goal SBP<120 -ADDENDUM: Patient's BP and HR proving extremely hard control. -Add Labetalol drip, HR/SBP goals remain the same. Titrate to goal with Labetalol and Cardene  Accelerated HTN -see CHF  Renal mass -2.7 cm enhancing renal mass. Worrisome for renal cell carcinoma has not been discussed with patient. -Will need follow-up with urology     DVT prophylaxis: SCD Code Status: Full Family  Communication: None Disposition Plan: Per vascular surgery   Consultants:  [place]  Vascular surgery    Procedures/Significant Events:  12/29 -Percutaneous Endovascular aneurysm repair using 3 components -Coil embolization of Left hypogastric artery.   VENTILATOR SETTINGS: NA   Cultures NA  Antimicrobials: Anti-infectives    Start     Stop   05/26/16 1300  cefUROXime (ZINACEF) 1.5 g in dextrose 5 % 50 mL IVPB     05/27/16 1259   05/26/16 0715  ceFAZolin (ANCEF) IVPB 2g/100 mL premix    Comments:  Send with pt to OR   05/26/16 0742   05/26/16 0000  ceFAZolin (ANCEF) IVPB 1 g/50 mL premix  Status:  Discontinued    Comments:  Send with pt to OR   05/26/16 0117       Devices NA   LINES / TUBES:      Continuous Infusions: . sodium chloride 50 mL/hr at 05/26/16 1200  . niCARDipine 8.5 mg/hr (05/26/16 1200)  . niCARDipine 8.5 mg/hr (05/26/16 1416)     Objective: Vitals:   05/26/16 1415 05/26/16 1420 05/26/16 1425 05/26/16 1430  BP: (!) 144/95   135/89  Pulse:      Resp: 18 19 19 18   Temp:      TempSrc:      SpO2:        Intake/Output Summary (Last 24 hours) at 05/26/16 1456 Last data filed at 05/26/16 1341  Gross per 24 hour  Intake          2311.67 ml  Output             2245 ml  Net  66.67 ml   There were no vitals filed for this visit.  Examination:  General: A/O 4, NAD, No acute respiratory distress Eyes: negative scleral hemorrhage, negative anisocoria, negative icterus ENT: Negative Runny nose, negative gingival bleeding, Neck:  Negative scars, masses, torticollis, lymphadenopathy, JVD Lungs: Clear to auscultation bilaterally without wheezes or crackles Cardiovascular: Tachycardic, Regular rhythm without murmur gallop or rub normal S1 and S2 Abdomen: negative abdominal pain, nondistended, positive soft, bowel sounds, no rebound, no ascites, no appreciable mass Extremities: No significant cyanosis, clubbing, or  edema bilateral lower extremities Skin: Negative rashes, lesions, ulcers Psychiatric:  Negative depression, negative anxiety, negative fatigue, negative mania  Central nervous system:  Cranial nerves II through XII intact, tongue/uvula midline, all extremities muscle strength 5/5, sensation intact throughout, negative dysarthria, negative expressive aphasia, negative receptive aphasia.  .     Data Reviewed: Care during the described time interval was provided by me .  I have reviewed this patient's available data, including medical history, events of note, physical examination, and all test results as part of my evaluation. I have personally reviewed and interpreted all radiology studies.  CBC:  Recent Labs Lab 05/25/16 1005 05/26/16 1027  WBC 11.5* 9.8  HGB 17.9* 15.8  HCT 50.0 44.5  MCV 78.4 79.0  PLT 175 [id number]   Basic Metabolic Panel:  Recent Labs Lab 05/25/16 1005 05/26/16 1027  NA 137 137  K 3.5 3.8  CL 101 102  CO2 27 23  GLUCOSE 94 122*  BUN 12 10  CREATININE 0.90 1.21  CALCIUM 8.9 8.6*  MG  --  1.7   GFR: CrCl cannot be calculated (Unknown ideal weight.). Liver Function Tests:  Recent Labs Lab 05/25/16 1005  AST 18  ALT 20  ALKPHOS 82  BILITOT 0.8  PROT 7.3  ALBUMIN 3.8   No results for input(s): LIPASE, AMYLASE in the last 168 hours. No results for input(s): AMMONIA in the last 168 hours. Coagulation Profile:  Recent Labs Lab 05/26/16 1330  INR 0.93   Cardiac Enzymes: No results for input(s): CKTOTAL, CKMB, CKMBINDEX, TROPONINI in the last 168 hours. BNP (last 3 results) No results for input(s): PROBNP in the last 8760 hours. HbA1C: No results for input(s): HGBA1C in the last 72 hours. CBG: No results for input(s): GLUCAP in the last 168 hours. Lipid Profile: No results for input(s): CHOL, HDL, LDLCALC, TRIG, CHOLHDL, LDLDIRECT in the last 72 hours. Thyroid Function Tests: No results for input(s): TSH, T4TOTAL, FREET4, T3FREE, THYROIDAB  in the last 72 hours. Anemia Panel: No results for input(s): VITAMINB12, FOLATE, FERRITIN, TIBC, IRON, RETICCTPCT in the last 72 hours. Urine analysis:    Component Value Date/Time   COLORURINE YELLOW 05/25/2016 [place] 05/25/2016 0943   LABSPEC 1.009 05/25/2016 0943   PHURINE 6.0 05/25/2016 0943   GLUCOSEU NEGATIVE 05/25/2016 0943   HGBUR SMALL (A) 05/25/2016 0943   BILIRUBINUR NEGATIVE 05/25/2016 0943   KETONESUR NEGATIVE 05/25/2016 0943   PROTEINUR 100 (A) 05/25/2016 0943   NITRITE NEGATIVE 05/25/2016 0943   LEUKOCYTESUR NEGATIVE 05/25/2016 0943   Sepsis Labs: @LABRCNTIP (procalcitonin:4,lacticidven:4)  ) Recent Results (from the past 240 hour(s))  MRSA PCR Screening     Status: None   Collection Time: 05/25/16  7:07 PM  Result Value Ref Range Status   MRSA by PCR NEGATIVE NEGATIVE Final    Comment:        The GeneXpert MRSA Assay (FDA approved for NASAL specimens only), is one component of a comprehensive MRSA colonization  surveillance program. It is not intended to diagnose MRSA infection nor to guide or monitor treatment for MRSA infections.          Radiology Studies: Ct Renal Stone Study  Result Date: 05/25/2016 CLINICAL DATA:  Left side groin pain beginning last night EXAM: CT ABDOMEN AND PELVIS WITHOUT CONTRAST TECHNIQUE: Multidetector CT imaging of the abdomen and pelvis was performed following the standard protocol without IV contrast. COMPARISON:  None. FINDINGS: Lower chest: Heart is normal size.  Lung bases clear.  No effusions. Hepatobiliary: No focal hepatic abnormality. Gallbladder unremarkable. Pancreas: No focal abnormality or ductal dilatation. Spleen: No focal abnormality.  Normal size. Adrenals/Urinary Tract: Indeterminate exophytic 2.4 cm lesion off the upper pole of the right kidney. This is not definitively cystic on this unenhanced study. This warrants additional evaluation. Second of low-density area in the upper pole of the  right kidney measures 15 mm and may represent a cyst but difficult to characterize on this unenhanced study. Small low-density area within the upper pole of the left kidney also difficult to characterize. No hydronephrosis. Adrenal glands and urinary bladder unremarkable. No renal or ureteral stones. No hydronephrosis. Stomach/Bowel: Appendix is normal. Stomach, large and small bowel grossly unremarkable. Vascular/Lymphatic: Saccular partially calcified aneurysm off the left side of the aorta measures up to 3.6 cm. Aneurysmal dilatation of the left common iliac artery measuring up to 3.1 cm. No adenopathy. Reproductive: No visible focal abnormality. Other: No free fluid or free air. Musculoskeletal: No acute bony abnormality or focal bone lesion. 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 Recommend vascular surgical consultation. Indeterminate exophytic lesion off the upper pole of the right kidney, 2.4 cm. This warrants additional workup with contrast-enhanced CT or MRI. Other additional low-density areas in the kidneys may be cysts but recommend attention on additional imaging. Normal appendix. No renal or ureteral stones.  No hydronephrosis. Electronically Signed   By: Rolm Baptise M.D.   On: 05/25/2016 10:53   Ct Angio Abd/pel W/ And/or W/o  Result Date: 05/25/2016 CLINICAL DATA:  Presented to the ED with left groin pain. Abdominal aortic aneurysm. Renal lesion. EXAM: CTA ABDOMEN AND PELVIS WITH CONTRAST TECHNIQUE: Multidetector CT imaging of the abdomen and pelvis was performed using the standard protocol during bolus administration of intravenous contrast. Multiplanar reconstructed images and MIPs were obtained and reviewed to evaluate the vascular anatomy. CONTRAST:  100 mL Isovue 370 IV COMPARISON:  Earlier noncontrast scan of the same day FINDINGS:  VASCULAR Aorta: Visualized distal descending thoracic and suprarenal segments unremarkable. There is a saccular aneurysm from the left lateral aspect of the infrarenal aorta measuring up to 4 cm maximum transverse diameter, extending across the bifurcation into the common iliac artery. There is some eccentric mural thrombus as well as slow flow within the lesion. There is some curvilinear calcification in the wall of the aneurysmal segment. No dissection flap. No significant stenosis. No adjacent retroperitoneal hemorrhage or inflammatory change. Celiac: Patent without evidence of aneurysm, dissection, vasculitis or significant stenosis. SMA: Patent without evidence of aneurysm, dissection, vasculitis or significant stenosis. Replaced right hepatic arterial supply, an anatomic variant. Renals: Both renal arteries are patent without evidence of aneurysm, dissection, vasculitis, fibromuscular dysplasia or significant stenosis. IMA: Patent, arising from the saccular aneurysm. Inflow: Aneurysmal dilatation of the left common iliac artery, contiguous with the saccular distal aortic aneurysm, measured up to  a 3.6 cm maximum transverse diameter, extending to the common iliac bifurcation. There is some eccentric mural thrombus in the aneurysmal segment. Mild inflammatory changes in the adjacent retroperitoneal fat. No dissection flap. No stenosis. Left internal and external iliac arteries are unremarkable. Right iliac arterial system has some scattered calcified plaque, the common iliac ectatic up to 15 mm. Proximal Outflow: Bilateral common femoral and visualized portions of the superficial and profunda femoral arteries are patent without evidence of aneurysm, dissection, vasculitis or significant stenosis. Veins: Patent iliac venous system, IVC, bilateral renal veins, hepatic veins, portal vein, SMV, splenic vein. Review of the MIP images confirms the above findings. NON-VASCULAR Lower chest: No acute abnormality.  Hepatobiliary: No focal liver abnormality is seen. No gallstones, gallbladder wall thickening, or biliary dilatation. Pancreas: Unremarkable. No pancreatic ductal dilatation or surrounding inflammatory changes. Spleen: Normal in size without focal abnormality. Adrenals/Urinary Tract: Normal adrenal glands. 2.7 cm enhancing partially exophytic upper pole right renal mass. Smaller bilateral renal cysts. No hydronephrosis. Urinary bladder is physiologically distended. Stomach/Bowel: Stomach is within normal limits. Appendix appears normal. No evidence of bowel wall thickening, distention, or inflammatory changes. Lymphatic: No adenopathy localized. Reproductive: Prostate is unremarkable. Other: No ascites.  No free air. Musculoskeletal: No acute or significant osseous findings. IMPRESSION: VASCULAR 1. 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 Findings may represent complications of infectious or autoimmune arteritis; atypical appearance for penetrating atheromatous ulcer or typical atherosclerotic change, with a paucity of disease elsewhere; no suggestion of dissection. The inflammatory changes around the common left common iliac artery along with regional pain suggest an acute exacerbation with elevated risk of rupture. Recommend vascular surgical consultation. NON-VASCULAR 1. 2.7 cm enhancing right upper pole renal mass suggesting renal cell carcinoma. Recommend urologic follow-up. Electronically Signed   By: Lucrezia Europe M.D.   On: 05/25/2016 13:29        Scheduled Meds: . cefUROXime (ZINACEF)  IV  1.5 g Intravenous Q12H  . [START ON 05/27/2016] docusate sodium  100 mg Oral Daily  . [START ON 05/27/2016] enoxaparin (LOVENOX) injection  40 mg Subcutaneous Q24H  . pantoprazole  40 mg Oral Daily  . sodium chloride flush  3 mL Intravenous Q12H   Continuous Infusions: . sodium chloride 50 mL/hr at 05/26/16 1200  . niCARDipine  8.5 mg/hr (05/26/16 1200)  . niCARDipine 8.5 mg/hr (05/26/16 1416)     LOS: 1 day    Time spent: 40 minutes    Elizabeth Paulsen, Geraldo Docker, MD [facility] Pager [phone]   If 7PM-7AM, please contact night-coverage [email or web address] Password [email or web address] 05/26/2016, 2:56 PM

## 2016-05-26 NOTE — Anesthesia Postprocedure Evaluation (Signed)
Anesthesia Post Note  Patient: Exzavior Kunis  Procedure(s) Performed: Procedure(s) (LRB): ABDOMINAL AORTIC ENDOVASCULAR STENT GRAFT (N/A) COIL EMBOLIZATION LEFT HYPOGASTRIC ARTERY (Left)  Patient location during evaluation: PACU Anesthesia Type: General Level of consciousness: awake, awake and alert and oriented Pain management: pain level controlled Vital Signs Assessment: post-procedure vital signs reviewed and stable Respiratory status: spontaneous breathing, nonlabored ventilation and respiratory function stable Cardiovascular status: blood pressure returned to baseline Postop Assessment: no headache Anesthetic complications: no       Last Vitals:  Vitals:   05/26/16 1425 05/26/16 1430  BP:  135/89  Pulse:    Resp: 19 18  Temp:      Last Pain:  Vitals:   05/26/16 1205  TempSrc:   PainSc: 0-No pain                 Kaitlyn Franko COKER

## 2016-05-26 NOTE — H&P (View-Only) (Signed)
Patient name: Nicholas Larson MRN: [id number] DOB: 1980-06-18 Sex: male  REASON FOR CONSULT: Saccular abdominal aortic aneurysm and left common iliac artery aneurysm. Consult is from the [facility] emergency department.  HPI: Nicholas Larson is a 35 y.o. male, who awoke at 34 PM last night with fairly severe left groin pain. This pain has been persistent. He did receive some Percocet in the emergency department which has helped. The pain is localized to the groin. He denies abdominal pain or back pain. He denies any chest pain. He had a CT scan without contrast to look for a kidney stone and an incidental finding was a 3.6 cm infrarenal aneurysm and aneurysm of the left common iliac artery. For this reason vascular surgery was consulted.   The patient does not have any family history of abdominal aortic aneurysms. His father had a brain aneurysm. He is a smoker. He smokes half a pack per day and has been smoking for over 15 years. He also has fairly poorly controlled blood pressure. He does not have a primary care physician.  His pain has improved since he has received pain medicine by mouth.   Past Medical History:  Diagnosis Date  . CHF (congestive heart failure) ([place])   . Hypertension     No family history on file.  There is no family history of premature cardiovascular disease. There is no family history of abdominal aortic aneurysm.   SOCIAL HISTORY: He is single. He does not have any children.  Social History   Social History  . Marital status: Married    Spouse name: N/A  . Number of children: N/A  . Years of education: N/A   Occupational History  . Not on file.   Social History Main Topics  . Smoking status: Current Every Day Smoker    Packs/day: 1.00    Types: Cigarettes  . Smokeless tobacco: Not on file  . Alcohol use Yes     Comment: daily "when I can"  . Drug use: No  . Sexual activity: Not on file   Other Topics Concern  . Not on file   Social History  Narrative  . No narrative on file    No Known Allergies  Current Facility-Administered Medications  Medication Dose Route Frequency Provider Last Rate Last Dose  . labetalol (NORMODYNE,TRANDATE) injection 10 mg  10 mg Intravenous Once Margette Fast, MD       Current Outpatient Prescriptions  Medication Sig Dispense Refill  . lisinopril (PRINIVIL,ZESTRIL) 20 MG tablet Take 20 mg by mouth daily.    Marland Kitchen lisinopril-hydrochlorothiazide (PRINZIDE,ZESTORETIC) 20-25 MG per tablet Take 1 tablet by mouth daily. (Patient not taking: Reported on 05/25/2016) 90 tablet 3  . oxyCODONE-acetaminophen (PERCOCET/ROXICET) 5-325 MG per tablet Take 1 tablet by mouth every 4 (four) hours as needed. (Patient not taking: Reported on 05/25/2016) 15 tablet 0  . predniSONE (DELTASONE) 20 MG tablet Take 2 tablets (40 mg total) by mouth daily. Take 40 mg by mouth daily for 3 days, then 20mg  by mouth daily for 3 days, then 10mg  daily for 3 days (Patient not taking: Reported on 05/25/2016) 12 tablet 0    REVIEW OF SYSTEMS:  [X]  denotes positive finding, [ ]  denotes negative finding Cardiac  Comments:  Chest pain or chest pressure:    Shortness of breath upon exertion:    Short of breath when lying flat:    Irregular heart rhythm:        Vascular    Pain  in calf, thigh, or hip brought on by ambulation:    Pain in feet at night that wakes you up from your sleep:     Blood clot in your veins:    Leg swelling:         Pulmonary    Oxygen at home:    Productive cough:     Wheezing:         Neurologic    Sudden weakness in arms or legs:     Sudden numbness in arms or legs:     Sudden onset of difficulty speaking or slurred speech:    Temporary loss of vision in one eye:     Problems with dizziness:         Gastrointestinal    Blood in stool:     Vomited blood:         Genitourinary    Burning when urinating:     Blood in urine:        Psychiatric    Major depression:         Hematologic    Bleeding  problems:    Problems with blood clotting too easily:        Skin    Rashes or ulcers:        Constitutional    Fever or chills:      PHYSICAL EXAM: Vitals:   05/25/16 0725 05/25/16 0941 05/25/16 1333  BP: (!) 179/117 (!) 174/120 (!) 175/114  Pulse: 90 73 71  Resp: 18 16 16   Temp: 99.2 F (37.3 C)    TempSrc: Oral    SpO2: 96% 99% 97%   '  GENERAL: The patient is a well-nourished male, in no acute distress. The vital signs are documented above. CARDIAC: There is a regular rate and rhythm.  VASCULAR: I do not detect carotid bruits. He has palpable femoral, popliteal, and pedal pulses. PULMONARY: There is good air exchange bilaterally without wheezing or rales. ABDOMEN: Soft and non-tender with normal pitched bowel sounds. He is morbidly obese and I cannot palpate his aneurysm. MUSCULOSKELETAL: There are no major deformities or cyanosis. NEUROLOGIC: No focal weakness or paresthesias are detected. SKIN: There are no ulcers or rashes noted. PSYCHIATRIC: The patient has a normal affect.  DATA:   CT ANGIO: I have reviewed the CT angiogram today. He has an unusual saccular aneurysm on the left lateral aspect of the infrarenal aorta which measures up proximally 4 cm in maximum diameter. This extends down into the left common iliac artery. There is no evidence of dissection.  There are some inflammatory changes around the left common iliac artery.   GFR is greater than 60. Creatinine is 0.9. His white count is 11.5.  ECHO: ECHo on 05/15/16 shows LVEF: 60-65%. Moderate LVH. Grade 2 diastolic dysfunction.   MEDICAL ISSUES:  SACCULAR ABDOMINAL AORTIC ANEURYSM AND LEFT COMMON ILIAC ARTERY ANEURYSM: Given the inflammation around the left common iliac artery, it is possible that his left groin pain could be related to his aneurysm. The aneurysm itself is very unusual and is saccular. He has not had any fever or chills. He denies any abdominal or back pain. Given that the aneurysm is  saccular with acute symptoms possibly related to the aneurysm, I would recommend repair. His blood pressure is very poorly controlled and he will be admitted by the medical service for control of his blood pressure. I plan on proceeding with endovascular aneurysm repair tomorrow morning. His renal function is normal. He is a  poor candidate for open repair given his morbid obesity.  I have discussed the indications for endovascular repair and the potential complications of surgery. I also explained the need for long-term follow up. We will need to coil embolize his left hypogastric artery at the time of surgery and I have explained this to him. His right hypogastric artery is widely patent.   [name] [facility] [phone]

## 2016-05-26 NOTE — Progress Notes (Signed)
Shift event note:  Multiple calls from RN regarding persistent hypertension refractory to multiple PRN IV meds and NTG qtt currently at 25 mcq/min. BP currently 142/112 after IV lopressor (5 mg). At bedside pt noted resting in NAD. Pt is awake and oriented and denies pain or h/a. BBS CTA. Abd is soft, nt w/ bs in all quads. Reports minimal pain in (L) groin.  Assessment/Plan: 1. Persistent hypertension: Refractory to NTG qtt and mx IV PRN meds. Discussed pt w/ Dr Jimmy Footman w/ [facility] who has recommended continued titration of  NTG qtt w/ alternating IVP PRN antihypertensives. She has placed orders for multiple PRN meds. Plan discussed w/ RN. Appreciate PCCM input. Will continue to monitor closely in SDU.   Jeryl Columbia, NP-C [facility] Pager [phone]

## 2016-05-26 NOTE — Op Note (Addendum)
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 Percutaneous Endovascular aneurysm repair using 3 components 2. Coil embolization of Left hypogastric artery.  SURGEON: Judeth Cornfield. Scot Dock, MD, FACS  ASSIST: Silva Bandy, [facility]  ANESTHESIA: Gen.   EBL: 100 cc  INDICATIONS: Nicholas Larson is a 35 y.o. male who presented with the acute onset of left groin pain and significant hypertension. His workup included a CT scan which showed a very unusual saccular aneurysm off the left lateral wall of the infrarenal aorta extending down into the left common iliac artery. I felt it likely the aneurysm was the source of his pain. When his blood pressure was better controlled by the medical service his pain resolved which would also support this. Given the risk of rupture I recommended repair.   FINDINGS: Completion arteriogram showed the graft and excellent position with no endoleak's.  TECHNIQUE: The patient was taken to the operating room and received a general anesthetic. An arterial line and IVs had been placed by anesthesia. The abdomen and groins were prepped and draped in usual sterile fashion. On the right side right common femoral artery was preclosed with 2 Perclose devices. The first device was rotated 15 medially and the second device was rotated 15 laterally. An 8 French sheath was placed on the right and this was later exchanged for a 10 [place] sheath.  I used a JR4 in combination with the [facility] wire and was able to cross over the aortic bifurcation and gain access to the left hypogastric artery. A catheter was advanced over the wire and positioned in the hypogastric artery and an arteriogram was obtained to confirm position. With the wire in position I exchanged the 8 French sheath in the right groin for a 10 French  sheath. I was then able to advance a 7 French bright tip catheter over the wire down into the left common iliac artery. I used a Berenstein 2 catheter to selectively cannulate a secondary branch of the left hypogastric artery and placed 28 mm coils. I then retracted the catheter and was able to cannulate the other major secondary branch and this too was coiled with 28 coils. This sheath was retracted into the common iliac artery for an arteriogram to confirm occlusion of these branches and maintaining patency of the common iliac and external iliac arteries.  The sheath was then retracted to the aortic bifurcation and an Amplatz wire placed up the right side. The 10 French sheath was exchanged for a 12 French sheath on the right. Prior to this I did preclosed the left common femoral artery with 2 Perclose devices and an 8 French sheath was placed in the left groin. An Amplatz wire was advanced up the left side and the 8 French sheath exchanged for a 16 French sheath after the patient was heparinized. The trunk ipsilateral component was brought from the left side. This was a 23 mm x 12 mm x 16 cm device. This was advanced through the sheath on the left and positioned at the appropriate level and the sheath was then retracted. A pigtail catheter had been placed up the right side at the bottom of the L2 vertebral body. Aortogram was obtained confirm position of the renal arteries. The proximal graft was then deployed with excellent position just below the renal arteries. The contralateral  gate was anterior.  I then cannulated the contralateral gate from the right side using a Berenstein 2 catheter and the [employer] wire. I then passed an Amplatz wire and exchanged the Berenstein 2 catheter for a pigtail catheter which was turned within the main body to confirm that it was intraluminal. The wire was then advanced and then a retrograde right iliac arteriogram obtained in order to demonstrate the position of the right  hypogastric artery. The contralateral limb selected was an 18 mm x 9.5 cm device. His was positioned into the contralateral limb with 3 cm of overlap in the ploidy without difficulty. Patency of the right hypogastric artery was maintained. Next the ipsilateral limb was deployed. A retrograde left femoral arteriograms obtained in order to demonstrate the position of the coiled hypogastric artery and a 10 mm x 7 cm iliac extender was positioned with 3 cm of overlap into the ipsilateral trunk and deployed without difficulty. Next using the Q 50 balloon I ballooned the proximal graft the overlap regions in the distal graft. Completion arteriogram was then obtained which showed the graft and excellent position with no type I or type II endoleak's.  On the right side the sheath was then removed with the wire left in place. He Perclose devices were secured and was good hemostasis. The wire was then removed and then the sutures were passed and again again with good hemostasis. Likewise on the left side [place] wire was placed and then the sheath was removed. The Perclose devices were secured and then was good hemostasis. The wire was removed and then again the sutures were secured and there was good hemostasis. Each of the skin incisions was closed with 4-0 Vicryl. The patient had palpable pedal pulses at the completion. He was seen in an anatomically stable.   [name], MD, FACS [facility]  DATE OF DICTATION:   05/26/2016

## 2016-05-26 NOTE — Progress Notes (Signed)
Patient transferred to OR with RN accompanying pt. PT on NTG gtt at 60mcg/hr. Latest BP 146/104, anesthesia aware of elevated BP and measures taken to control BP.

## 2016-05-26 NOTE — Interval H&P Note (Signed)
History and Physical Interval Note:  05/26/2016 6:33 AM  Nicholas Larson  has presented today for surgery, with the diagnosis of Abdominal Aortic Aneurysm I71.4  The various methods of treatment have been discussed with the patient and family. After consideration of risks, benefits and other options for treatment, the patient has consented to  Procedure(s): ABDOMINAL AORTIC ENDOVASCULAR STENT GRAFT (N/A) COIL EMBOLIZATION LEFT HYPOGASTRIC ARTERY (Left) as a surgical intervention .  The patient's history has been reviewed, patient examined, no change in status, stable for surgery.  I have reviewed the patient's chart and labs.  Questions were answered to the patient's satisfaction.     [name]

## 2016-05-26 NOTE — Transfer of Care (Signed)
Immediate Anesthesia Transfer of Care Note  Patient: Nicholas Larson  Procedure(s) Performed: Procedure(s): ABDOMINAL AORTIC ENDOVASCULAR STENT GRAFT (N/A) COIL EMBOLIZATION LEFT HYPOGASTRIC ARTERY (Left)  Patient Location: PACU  Anesthesia Type:General  Level of Consciousness: awake, oriented, patient cooperative and responds to stimulation  Airway & Oxygen Therapy: Patient Spontanous Breathing and Patient connected to nasal cannula oxygen  Post-op Assessment: Report given to RN, Post -op Vital signs reviewed and stable and Patient moving all extremities X 4  Post vital signs: Reviewed and stable  Last Vitals:  Vitals:   05/26/16 0600 05/26/16 0620  BP: (!) 167/95 (!) 146/104  Pulse: 79 84  Resp: 18 18  Temp:      Last Pain:  Vitals:   05/26/16 0530  TempSrc: Axillary  PainSc:          Complications: No apparent anesthesia complications

## 2016-05-26 NOTE — Progress Notes (Signed)
Dr. Sherral Hammers notified that pt's HR is 125-130.  Orders received.  Will continue to monitor closely.

## 2016-05-26 NOTE — Anesthesia Preprocedure Evaluation (Addendum)
Anesthesia Evaluation  Patient identified by MRN, date of birth, ID band Patient awake    History of Anesthesia Complications (+) MALIGNANT HYPERTHERMIA  Airway Mallampati: I  TM Distance: >3 FB Neck ROM: Full    Dental  (+) Teeth Intact, Dental Advisory Given, Chipped,    Pulmonary former smoker,    breath sounds clear to auscultation       Cardiovascular hypertension, Pt. on medications + Peripheral Vascular Disease and +CHF   Rhythm:Regular Rate:Normal     Neuro/Psych negative neurological ROS  negative psych ROS   GI/Hepatic negative GI ROS, Neg liver ROS,   Endo/Other  negative endocrine ROS  Renal/GU negative Renal ROS     Musculoskeletal   Abdominal (+) + obese,   Peds  Hematology negative hematology ROS (+)   Anesthesia Other Findings   Reproductive/Obstetrics                            Anesthesia Physical Anesthesia Plan  ASA: III  Anesthesia Plan: General   Post-op Pain Management:    Induction: Intravenous  Airway Management Planned: Oral ETT  Additional Equipment: Arterial line  Intra-op Plan:   Post-operative Plan: Extubation in OR  Informed Consent: I have reviewed the patients History and Physical, chart, labs and discussed the procedure including the risks, benefits and alternatives for the proposed anesthesia with the patient or authorized representative who has indicated his/her understanding and acceptance.   Dental advisory given  Plan Discussed with: Anesthesiologist, Surgeon and CRNA  Anesthesia Plan Comments:        Anesthesia Quick Evaluation

## 2016-05-26 NOTE — Progress Notes (Signed)
   VASCULAR SURGERY POSTOP CHECK:  Stable post op  In ICU for BP control.  SUBJECTIVE: No pain.   PHYSICAL EXAM: Vitals:   05/26/16 1415 05/26/16 1420 05/26/16 1425 05/26/16 1430  BP: (!) 144/95   135/89  Pulse:      Resp: 18 19 19 18   Temp:      TempSrc:      SpO2:       Palpable pedal pulses Groins look fine.   LABS: Lab Results  Component Value Date   WBC 9.8 05/26/2016   HGB 15.8 05/26/2016   HCT 44.5 05/26/2016   MCV 79.0 05/26/2016   PLT 172 05/26/2016   Lab Results  Component Value Date   CREATININE 1.21 05/26/2016   Lab Results  Component Value Date   INR 0.93 05/26/2016   Principal Problem:   Saccular aneurysm Active Problems:   Accelerated hypertension   Renal mass   Gae Gallop Beeper: [id number] 05/26/2016

## 2016-05-27 DIAGNOSIS — Z8679 Personal history of other diseases of the circulatory system: Secondary | ICD-10-CM

## 2016-05-27 DIAGNOSIS — Z9889 Other specified postprocedural states: Secondary | ICD-10-CM

## 2016-05-27 LAB — BASIC METABOLIC PANEL
ANION GAP: 11 (ref 5–15)
BUN: 11 mg/dL (ref 6–20)
CHLORIDE: 104 mmol/L (ref 101–111)
CO2: 23 mmol/L (ref 22–32)
Calcium: 8.9 mg/dL (ref 8.9–10.3)
Creatinine, Ser: 0.97 mg/dL (ref 0.61–1.24)
Glucose, Bld: 104 mg/dL — ABNORMAL HIGH (ref 65–99)
POTASSIUM: 3.9 mmol/L (ref 3.5–5.1)
SODIUM: 138 mmol/L (ref 135–145)

## 2016-05-27 LAB — CBC
HCT: 46.5 % (ref 39.0–52.0)
HEMOGLOBIN: 16.4 g/dL (ref 13.0–17.0)
MCH: 27.7 pg (ref 26.0–34.0)
MCHC: 35.3 g/dL (ref 30.0–36.0)
MCV: 78.4 fL (ref 78.0–100.0)
PLATELETS: 175 10*3/uL (ref 150–400)
RBC: 5.93 MIL/uL — AB (ref 4.22–5.81)
RDW: 13.7 % (ref 11.5–15.5)
WBC: 12.8 10*3/uL — AB (ref 4.0–10.5)

## 2016-05-27 LAB — MAGNESIUM: MAGNESIUM: 1.9 mg/dL (ref 1.7–2.4)

## 2016-05-27 MED ORDER — LABETALOL HCL 100 MG PO TABS: 100.0000 mg | ORAL_TABLET | Freq: Two times a day (BID) | ORAL | Status: DC

## 2016-05-27 MED ORDER — METOPROLOL TARTRATE 50 MG PO TABS
50.0000 mg | ORAL_TABLET | Freq: Two times a day (BID) | ORAL | Status: DC
  Administered 2016-05-27 – 2016-05-28 (×2): 50 mg via ORAL
  Filled 2016-05-27 (×2): qty 1

## 2016-05-27 NOTE — Progress Notes (Signed)
PROGRESS NOTE    Nicholas Larson  [id number] DOB: 1981/01/28 DOA: 05/25/2016 PCP: No PCP Per Patient   Brief Narrative:  35 yo BM PMHx HTN, Chronic Diastolic CHF,  Presented to the emergency department with acute onset groin pain. 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 Vascular surgery consult, recommends transfer to [facility] for operative repair 12/29 and aggressive blood pressure control. Only comorbid condition is hypertension which is currently poorly controlled today but asymptomatic.  Patient reports no particular problems until developing sudden left groin pain last night at 10 PM. It has been constant since this time and fairly intense without aggravating or alleviating factors. Pain intensifies periodically. He denies any previous vascular problems. He reports blood pressures normally well controlled and a few data points available in the medical record confirm blood pressures normally well controlled. He is on monotherapy with lisinopril. No chest pain, shortness of breath or visual changes.    Subjective: 12/30  A/O 4, NAD. Ambulated around ward.    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  Chronic Diastolic CHF -Strict I and O's since admission -1.7 L -Daily weight Filed Weights   05/27/16 0600  Weight: 92 kg (202 lb 13.2 oz)  -hDiscontinued Cardene drip -Goal HR <100: Goal SBP<120 -12/30 DC Labetalol drip start Metoprolol 50 mg BID, titrate to goal  Accelerated HTN -see CHF  Renal mass -2.7 cm enhancing renal mass. Worrisome for renal cell carcinoma has not been discussed with patient. -Will need follow-up with urology     DVT  prophylaxis: SCD Code Status: Full Family Communication: None Disposition Plan: Per vascular surgery   Consultants:  [place]  Vascular surgery    Procedures/Significant Events:  12 /18 echocardiogram:Left ventricle: moderate concentric hypertrophy. -LVEF =60% to 65%. Wall - (grade 2 diastolic dysfunction). -- Atrial septum: Atrial septal aneurysm. Cannot exclude a small PFO   by color doppler. - Pulmonary arteries: PA peak pressure: 25 mm Hg (S). 12/29 -Percutaneous Endovascular aneurysm repair using 3 components -Coil embolization of Left hypogastric artery.   VENTILATOR SETTINGS: NA   Cultures NA  Antimicrobials: Anti-infectives    Start     Stop   05/26/16 1300  cefUROXime (ZINACEF) 1.5 g in dextrose 5 % 50 mL IVPB     05/27/16 1259   05/26/16 0715  ceFAZolin (ANCEF) IVPB 2g/100 mL premix    Comments:  Send with pt to OR   05/26/16 0742   05/26/16 0000  ceFAZolin (ANCEF) IVPB 1 g/50 mL premix  Status:  Discontinued    Comments:  Send with pt to OR   05/26/16 0117       Devices NA   LINES / TUBES:      Continuous Infusions: . sodium chloride 50 mL/hr at 05/27/16 0700  . labetalol (NORMODYNE) infusion 2 mg/min (05/27/16 0700)  . niCARDipine 6 mg/hr (05/27/16 0700)     Objective: Vitals:   05/27/16 0600 05/27/16 0615 05/27/16 0630 05/27/16 0700  BP: 106/69 118/70 117/69 117/77  Pulse: 92 92 94 89  Resp: 16 17 15 18   Temp:      TempSrc:      SpO2: 94% 94% 94% 98%  Weight: 92 kg (202 lb 13.2 oz)  Height: 5\' 8"  (1.727 m)       Intake/Output Summary (Last 24 hours) at 05/27/16 0758 Last data filed at 05/27/16 0700  Gross per 24 hour  Intake          4430.63 ml  Output             5135 ml  Net          -704.37 ml   Filed Weights   05/27/16 0600  Weight: 92 kg (202 lb 13.2 oz)    Examination:  General: A/O 4, NAD, No acute respiratory distress Eyes: negative scleral hemorrhage, negative anisocoria, negative  icterus ENT: Negative Runny nose, negative gingival bleeding, Neck:  Negative scars, masses, torticollis, lymphadenopathy, JVD Lungs: Clear to auscultation bilaterally without wheezes or crackles Cardiovascular: Regular rhythm And rate without murmur gallop or rub normal S1 and S2 Abdomen: negative abdominal pain, nondistended, positive soft, bowel sounds, no rebound, no ascites, no appreciable mass Extremities: No significant cyanosis, clubbing, or edema bilateral lower extremities Skin: Negative rashes, lesions, ulcers Psychiatric:  Negative depression, negative anxiety, negative fatigue, negative mania  Central nervous system:  Cranial nerves II through XII intact, tongue/uvula midline, all extremities muscle strength 5/5, sensation intact throughout, negative dysarthria, negative expressive aphasia, negative receptive aphasia.  .     Data Reviewed: Care during the described time interval was provided by me .  I have reviewed this patient's available data, including medical history, events of note, physical examination, and all test results as part of my evaluation. I have personally reviewed and interpreted all radiology studies.  CBC:  Recent Labs Lab 05/25/16 1005 05/26/16 1027 05/27/16 0357  WBC 11.5* 9.8 12.8*  HGB 17.9* 15.8 16.4  HCT 50.0 44.5 46.5  MCV 78.4 79.0 78.4  PLT 175 172 0000000   Basic Metabolic Panel:  Recent Labs Lab 05/25/16 1005 05/26/16 1027 05/27/16 0357  NA 137 137 138  K 3.5 3.8 3.9  CL 101 102 104  CO2 27 23 23   GLUCOSE 94 122* 104*  BUN 12 10 11   CREATININE 0.90 1.21 0.97  CALCIUM 8.9 8.6* 8.9  MG  --  1.7 1.9   GFR: Estimated Creatinine Clearance: 115.9 mL/min (by C-G formula based on SCr of 0.97 mg/dL). Liver Function Tests:  Recent Labs Lab 05/25/16 1005  AST 18  ALT 20  ALKPHOS 82  BILITOT 0.8  PROT 7.3  ALBUMIN 3.8   No results for input(s): LIPASE, AMYLASE in the last 168 hours. No results for input(s): AMMONIA in the last  168 hours. Coagulation Profile:  Recent Labs Lab 05/26/16 1330  INR 0.93   Cardiac Enzymes: No results for input(s): CKTOTAL, CKMB, CKMBINDEX, TROPONINI in the last 168 hours. BNP (last 3 results) No results for input(s): PROBNP in the last 8760 hours. HbA1C: No results for input(s): HGBA1C in the last 72 hours. CBG: No results for input(s): GLUCAP in the last 168 hours. Lipid Profile: No results for input(s): CHOL, HDL, LDLCALC, TRIG, CHOLHDL, LDLDIRECT in the last 72 hours. Thyroid Function Tests: No results for input(s): TSH, T4TOTAL, FREET4, T3FREE, THYROIDAB in the last 72 hours. Anemia Panel: No results for input(s): VITAMINB12, FOLATE, FERRITIN, TIBC, IRON, RETICCTPCT in the last 72 hours. Urine analysis:    Component Value Date/Time   COLORURINE YELLOW 05/25/2016 [place] 05/25/2016 0943   LABSPEC 1.009 05/25/2016 0943   PHURINE 6.0 05/25/2016 0943   GLUCOSEU NEGATIVE 05/25/2016 0943   HGBUR SMALL (A) 05/25/2016 [id number]  BILIRUBINUR NEGATIVE 05/25/2016 [place] 05/25/2016 0943   PROTEINUR 100 (A) 05/25/2016 0943   NITRITE NEGATIVE 05/25/2016 0943   LEUKOCYTESUR NEGATIVE 05/25/2016 0943   Sepsis Labs: @LABRCNTIP (procalcitonin:4,lacticidven:4)  ) Recent Results (from the past 240 hour(s))  MRSA PCR Screening     Status: None   Collection Time: 05/25/16  7:07 PM  Result Value Ref Range Status   MRSA by PCR NEGATIVE NEGATIVE Final    Comment:        The GeneXpert MRSA Assay (FDA approved for NASAL specimens only), is one component of a comprehensive MRSA colonization surveillance program. It is not intended to diagnose MRSA infection nor to guide or monitor treatment for MRSA infections.          Radiology Studies: Ct Renal Stone Study  Result Date: 05/25/2016 CLINICAL DATA:  Left side groin pain beginning last night EXAM: CT ABDOMEN AND PELVIS WITHOUT CONTRAST TECHNIQUE: Multidetector CT imaging of the abdomen and  pelvis was performed following the standard protocol without IV contrast. COMPARISON:  None. FINDINGS: Lower chest: Heart is normal size.  Lung bases clear.  No effusions. Hepatobiliary: No focal hepatic abnormality. Gallbladder unremarkable. Pancreas: No focal abnormality or ductal dilatation. Spleen: No focal abnormality.  Normal size. Adrenals/Urinary Tract: Indeterminate exophytic 2.4 cm lesion off the upper pole of the right kidney. This is not definitively cystic on this unenhanced study. This warrants additional evaluation. Second of low-density area in the upper pole of the right kidney measures 15 mm and may represent a cyst but difficult to characterize on this unenhanced study. Small low-density area within the upper pole of the left kidney also difficult to characterize. No hydronephrosis. Adrenal glands and urinary bladder unremarkable. No renal or ureteral stones. No hydronephrosis. Stomach/Bowel: Appendix is normal. Stomach, large and small bowel grossly unremarkable. Vascular/Lymphatic: Saccular partially calcified aneurysm off the left side of the aorta measures up to 3.6 cm. Aneurysmal dilatation of the left common iliac artery measuring up to 3.1 cm. No adenopathy. Reproductive: No visible focal abnormality. Other: No free fluid or free air. Musculoskeletal: No acute bony abnormality or focal bone lesion. 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 Recommend vascular surgical consultation. Indeterminate exophytic lesion off the upper pole of the right kidney, 2.4 cm. This warrants additional workup with contrast-enhanced CT or MRI. Other additional low-density areas in the kidneys may be cysts but recommend attention on additional imaging. Normal appendix. No renal or ureteral stones.  No hydronephrosis. Electronically Signed   By: Rolm Baptise  M.D.   On: 05/25/2016 10:53   Ct Angio Abd/pel W/ And/or W/o  Result Date: 05/25/2016 CLINICAL DATA:  Presented to the ED with left groin pain. Abdominal aortic aneurysm. Renal lesion. EXAM: CTA ABDOMEN AND PELVIS WITH CONTRAST TECHNIQUE: Multidetector CT imaging of the abdomen and pelvis was performed using the standard protocol during bolus administration of intravenous contrast. Multiplanar reconstructed images and MIPs were obtained and reviewed to evaluate the vascular anatomy. CONTRAST:  100 mL Isovue 370 IV COMPARISON:  Earlier noncontrast scan of the same day FINDINGS: VASCULAR Aorta: Visualized distal descending thoracic and suprarenal segments unremarkable. There is a saccular aneurysm from the left lateral aspect of the infrarenal aorta measuring up to 4 cm maximum transverse diameter, extending across the bifurcation into the common iliac artery. There is some eccentric mural thrombus as well  as slow flow within the lesion. There is some curvilinear calcification in the wall of the aneurysmal segment. No dissection flap. No significant stenosis. No adjacent retroperitoneal hemorrhage or inflammatory change. Celiac: Patent without evidence of aneurysm, dissection, vasculitis or significant stenosis. SMA: Patent without evidence of aneurysm, dissection, vasculitis or significant stenosis. Replaced right hepatic arterial supply, an anatomic variant. Renals: Both renal arteries are patent without evidence of aneurysm, dissection, vasculitis, fibromuscular dysplasia or significant stenosis. IMA: Patent, arising from the saccular aneurysm. Inflow: Aneurysmal dilatation of the left common iliac artery, contiguous with the saccular distal aortic aneurysm, measured up to a 3.6 cm maximum transverse diameter, extending to the common iliac bifurcation. There is some eccentric mural thrombus in the aneurysmal segment. Mild inflammatory changes in the adjacent retroperitoneal fat. No dissection flap. No  stenosis. Left internal and external iliac arteries are unremarkable. Right iliac arterial system has some scattered calcified plaque, the common iliac ectatic up to 15 mm. Proximal Outflow: Bilateral common femoral and visualized portions of the superficial and profunda femoral arteries are patent without evidence of aneurysm, dissection, vasculitis or significant stenosis. Veins: Patent iliac venous system, IVC, bilateral renal veins, hepatic veins, portal vein, SMV, splenic vein. Review of the MIP images confirms the above findings. NON-VASCULAR Lower chest: No acute abnormality. Hepatobiliary: No focal liver abnormality is seen. No gallstones, gallbladder wall thickening, or biliary dilatation. Pancreas: Unremarkable. No pancreatic ductal dilatation or surrounding inflammatory changes. Spleen: Normal in size without focal abnormality. Adrenals/Urinary Tract: Normal adrenal glands. 2.7 cm enhancing partially exophytic upper pole right renal mass. Smaller bilateral renal cysts. No hydronephrosis. Urinary bladder is physiologically distended. Stomach/Bowel: Stomach is within normal limits. Appendix appears normal. No evidence of bowel wall thickening, distention, or inflammatory changes. Lymphatic: No adenopathy localized. Reproductive: Prostate is unremarkable. Other: No ascites.  No free air. Musculoskeletal: No acute or significant osseous findings. IMPRESSION: VASCULAR 1. 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 Findings may represent complications of infectious or autoimmune arteritis; atypical appearance for penetrating atheromatous ulcer or typical atherosclerotic change, with a paucity of disease elsewhere; no suggestion of dissection. The inflammatory changes around the common left common iliac artery along with regional pain suggest an acute exacerbation with elevated risk of rupture. Recommend vascular surgical  consultation. NON-VASCULAR 1. 2.7 cm enhancing right upper pole renal mass suggesting renal cell carcinoma. Recommend urologic follow-up. Electronically Signed   By: Lucrezia Europe M.D.   On: 05/25/2016 13:29        Scheduled Meds: . docusate sodium  100 mg Oral Daily  . enoxaparin (LOVENOX) injection  40 mg Subcutaneous Q24H  . mouth rinse  15 mL Mouth Rinse BID  . pantoprazole  40 mg Oral Daily  . sodium chloride flush  3 mL Intravenous Q12H   Continuous Infusions: . sodium chloride 50 mL/hr at 05/27/16 0700  . labetalol (NORMODYNE) infusion 2 mg/min (05/27/16 0700)  . niCARDipine 6 mg/hr (05/27/16 0700)     LOS: 2 days    Time spent: 40 minutes    Antwanette Wesche, Geraldo Docker, MD [facility] Pager [phone]   If 7PM-7AM, please contact night-coverage [email or web address] Password [email or web address] 05/27/2016, 7:58 AM

## 2016-05-27 NOTE — Progress Notes (Addendum)
[facility]  Subjective  - Feeling well this am.   Objective 117/77 89 99.1 F (37.3 C) (Oral) 18 98%  Intake/Output Summary (Last 24 hours) at 05/27/16 0748 Last data filed at 05/27/16 0700  Gross per 24 hour  Intake          4430.63 ml  Output             5135 ml  Net          -704.37 ml    Palpable PT pulses bilaterally Groins soft Lungs non labored breathing Heart RRRR  Assessment/Planning: POD # 1 EVAR BP 117/77 on IV Labetalol and Cardene   Ambulated, BP better controlled with IV medications, KVO fluids and D/C foley. Stable from a vascular post op point of view  Laurence Slate [facility] 05/27/2016 7:48 AM --  Laboratory Lab Results:  Recent Labs  05/26/16 1027 05/27/16 0357  WBC 9.8 12.8*  HGB 15.8 16.4  HCT 44.5 46.5  PLT 172 175   BMET  Recent Labs  05/26/16 1027 05/27/16 0357  NA 137 138  K 3.8 3.9  CL 102 104  CO2 23 23  GLUCOSE 122* 104*  BUN 10 11  CREATININE 1.21 0.97  CALCIUM 8.6* 8.9    COAG Lab Results  Component Value Date   INR 0.93 05/26/2016   No results found for: PTT  I agree with the above, I have seen and examined the patient.  He is POD#1, s/p EVAR.  His pre-operative pain has resolved.  His incisions are soft and he has palpable pedal pulses.  He has ambulated and voided.  He is stable for d/c from a vascular perspective, however he is still on IV HTN medication which will need to be converted to PO.  Nicholas Larson

## 2016-05-28 DIAGNOSIS — N2889 Other specified disorders of kidney and ureter: Secondary | ICD-10-CM

## 2016-05-28 MED ORDER — METOPROLOL TARTRATE 50 MG PO TABS: 50.0000 mg | ORAL_TABLET | Freq: Two times a day (BID) | ORAL | 0 refills | Status: DC

## 2016-05-28 MED ORDER — LISINOPRIL 20 MG PO TABS: 20.0000 mg | ORAL_TABLET | Freq: Every day | ORAL | 0 refills | Status: DC

## 2016-05-28 MED ORDER — LISINOPRIL 20 MG PO TABS
20.0000 mg | ORAL_TABLET | Freq: Every day | ORAL | Status: DC
  Administered 2016-05-28: 20 mg via ORAL
  Filled 2016-05-28: qty 1

## 2016-05-28 NOTE — Progress Notes (Signed)
    Subjective  - POD #2  No complaints this am Doing well with ambulation   Physical Exam:  Groins soft Palpable pedal pulses       Assessment/Plan:  POD #2  Now off IV HTN meds Stable for d/c from vascular perspective F/U with Dr. Scot Dock in 1 month with CTA  Annamarie Major 05/28/2016 10:44 AM --  Vitals:   05/28/16 0700 05/28/16 0800  BP: (!) 126/92 120/89  Pulse: 82 73  Resp: 18 17  Temp:      Intake/Output Summary (Last 24 hours) at 05/28/16 1044 Last data filed at 05/28/16 0500  Gross per 24 hour  Intake              450 ml  Output             2600 ml  Net            -2150 ml     Laboratory CBC    Component Value Date/Time   WBC 12.8 (H) 05/27/2016 0357   HGB 16.4 05/27/2016 0357   HCT 46.5 05/27/2016 0357   PLT 175 05/27/2016 0357    BMET    Component Value Date/Time   NA 138 05/27/2016 0357   K 3.9 05/27/2016 0357   CL 104 05/27/2016 0357   CO2 23 05/27/2016 0357   GLUCOSE 104 (H) 05/27/2016 0357   BUN 11 05/27/2016 0357   CREATININE 0.97 05/27/2016 0357   CREATININE 0.95 01/22/2015 1212   CALCIUM 8.9 05/27/2016 0357   GFRNONAA >60 05/27/2016 0357   GFRNONAA >89 01/22/2015 1212   GFRAA >60 05/27/2016 0357   GFRAA >89 01/22/2015 1212    COAG Lab Results  Component Value Date   INR 0.93 05/26/2016   No results found for: PTT  Antibiotics Anti-infectives    Start     Dose/Rate Route Frequency Ordered Stop   05/26/16 1300  cefUROXime (ZINACEF) 1.5 g in dextrose 5 % 50 mL IVPB     1.5 g 100 mL/hr over 30 Minutes Intravenous Every 12 hours 05/26/16 1258 05/27/16 0045   05/26/16 0715  ceFAZolin (ANCEF) IVPB 2g/100 mL premix    Comments:  Send with pt to OR   2 g 200 mL/hr over 30 Minutes Intravenous To ShortStay Surgical 05/26/16 0046 05/26/16 0742   05/26/16 0000  ceFAZolin (ANCEF) IVPB 1 g/50 mL premix  Status:  Discontinued    Comments:  Send with pt to OR   1 g 100 mL/hr over 30 Minutes Intravenous On call 05/25/16  1846 05/26/16 0117       V. Leia Alf, M.D. [facility] Office: [phone] Pager:  [phone]

## 2016-05-28 NOTE — Discharge Summary (Addendum)
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 -Schedule follow-up with Dr.Christopher Nicole Cella  Vascular surgery in one month infrarenal aortic aneurysm.  Chronic Diastolic CHF -Strict I and O's since admission -3.0 L -Daily weight Filed Weights   05/27/16 0600 05/28/16 0600  Weight: 92 kg (202 lb 13.2 oz) 90.9 kg (200 lb 6.4 oz)  -Goal HR <100: Goal SBP<120 -Metoprolol 50 mg BID, titrate to goal -Lisinopril 20 mg daily titrate to goal -Schedule a establish care appointment at [facility] in 1-2 weeks for accelerated hypertension, chronic diastolic CHF, renal mass   Accelerated HTN -see CHF  Right Renal mass -2.7 cm enhancing renal mass. Worrisome for renal cell carcinoma has not been discussed with patient. -Once established at [facility] will require referral to UrologySurgery for workup of renal mass (biopsy vs partial nephrectomy vs total nephrectomy). Requires CT with contrast or MRI     Discharge Diagnoses:  Principal Problem:   Saccular aneurysm Active Problems:   Accelerated hypertension   Renal mass   AAA (abdominal aortic aneurysm) (HCC)   Status post percutaneous abdominal aortic aneurysm (AAA) repair   Right renal mass   Discharge Condition:  Stable  Diet recommendation: heart healthy  Filed Weights   05/27/16 0600 05/28/16 0600  Weight: 92 kg (202 lb 13.2 oz) 90.9 kg (200 lb 6.4 oz)    History of present illness:  35 yo BM PMHx HTN, Chronic Diastolic CHF,  Presented to the emergency department with acute onset groin pain. 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 Vascular surgery consult, recommends transfer to [facility] for operative repair 12/29 and aggressive blood pressure control. Only comorbid condition is hypertension which is currently poorly controlled today but asymptomatic.  Patient reports no particular problems until developing sudden left groin pain last night at 10 PM. It has been constant since this time and fairly intense without aggravating or alleviating factors. Pain intensifies periodically. He denies any previous vascular problems. He reports blood pressures normally well controlled and a few data points available in the medical record confirm blood pressures normally well controlled. He is on monotherapy with lisinopril. No chest pain, shortness of breath or visual changes. During this hospitalization patient was treated forSaccular aneurysm of the infrarenal aorta with fusiform left common iliac artery aneurysm by vascular surgery. In addition patient's accelerated hypertension was brought under control. In the course of working outpatient a renal mass was discovered which will require further workup.      Consultants:  [place]  Vascular surgery    Procedures/Significant Events:  12 /18 echocardiogram:Left ventricle: moderate concentric hypertrophy. -LVEF =60% to 65%. Wall - (grade 2 diastolicdysfunction). -- Atrial septum: Atrial septal aneurysm. Cannot exclude a small PFO by color doppler. - Pulmonary arteries: PA peak pressure: 25 mm Hg (S). 12/28 CT abdomen and pelvis WO contrast:-Saccular, partially calcified abdominal aortic aneurysm off the left side of the infrarenal aorta measuring up to 3.6 cm.  -Aneurysmal dilatation of the left common iliac artery at 3.1 cm.  -Indeterminate exophytic lesion off the upper pole of the right kidney, 2.4 cm. 12/29 -Percutaneous Endovascular aneurysm repair  using 3 components -Coil  embolization of Left hypogastric artery.  Antimicrobials: Anti-infectives    Start     Stop   05/26/16 1300  cefUROXime (ZINACEF) 1.5 g in dextrose 5 % 50 mL IVPB     05/27/16 0045   05/26/16 0715  ceFAZolin (ANCEF) IVPB 2g/100 mL premix    Comments:  Send with pt to OR   05/26/16 0742   05/26/16 0000  ceFAZolin (ANCEF) IVPB 1 g/50 mL premix  Status:  Discontinued    Comments:  Send with pt to OR   05/26/16 0117       Discharge Exam: Vitals:   05/28/16 0800 05/28/16 1200 05/28/16 1235 05/28/16 1300  BP: 120/89 (!) 124/92  (!) 128/93  Pulse: 73 86  86  Resp: 17 19  20   Temp:   98.5 F (36.9 C)   TempSrc:   Oral   SpO2: 98% 97%  97%  Weight:      Height:        General: A/O 4, NAD, No acute respiratory distress Eyes: negative scleral hemorrhage, negative anisocoria, negative icterus ENT: Negative Runny nose, negative gingival bleeding, Neck:  Negative scars, masses, torticollis, lymphadenopathy, JVD Lungs: Clear to auscultation bilaterally without wheezes or crackles Cardiovascular: Regular rhythm And rate without murmur gallop or rub normal S1 and S2 Abdomen: negative abdominal pain, nondistended, positive soft, bowel sounds, no rebound, no ascites, no appreciable mass   Discharge Instructions   Allergies as of 05/28/2016   No Known Allergies     Medication List    STOP taking these medications   lisinopril-hydrochlorothiazide 20-25 MG tablet Commonly known as:  PRINZIDE,ZESTORETIC   oxyCODONE-acetaminophen 5-325 MG tablet Commonly known as:  PERCOCET/ROXICET   predniSONE 20 MG tablet Commonly known as:  DELTASONE     TAKE these medications   lisinopril 20 MG tablet Commonly known as:  PRINIVIL,ZESTRIL Take 1 tablet (20 mg total) by mouth daily. Start taking on:  05/29/2016   metoprolol 50 MG tablet Commonly known as:  LOPRESSOR Take 1 tablet (50 mg total) by mouth 2 (two) times daily.      No Known Allergies Follow-up Information    [name], MD. Schedule an appointment as soon as possible for a visit in 1 month(s).   Specialties:  Vascular Surgery, Cardiology Why:  Schedule follow-up with Dr.Christopher Nicole Cella  Vascular surgery in one month infrarenal aortic aneurysm. Contact information: [address] [place] [address] [place]. Schedule an appointment as soon as possible for a visit in 2 week(s).   Why:  Schedule a establish care appointment at [facility] and [place] in 1-2 weeks for accelerated hypertension, chronic diastolic CHF, MRI or CT WO contrast RIGHT RENAL MASS Contact information: [address] [place] [phone]           The results of significant diagnostics from this hospitalization (including imaging, microbiology, ancillary and laboratory) are listed below for reference.    Significant Diagnostic Studies: Ct Renal Stone Study  Result Date: 05/25/2016 CLINICAL DATA:  Left side groin pain beginning last night EXAM: CT ABDOMEN AND PELVIS WITHOUT CONTRAST TECHNIQUE: Multidetector CT imaging of the abdomen and pelvis was performed following the standard protocol without IV contrast. COMPARISON:  None. FINDINGS: Lower chest: Heart is normal size.  Lung bases clear.  No effusions. Hepatobiliary: No focal hepatic abnormality. Gallbladder unremarkable. Pancreas: No focal abnormality  or ductal dilatation. Spleen: No focal abnormality.  Normal size. Adrenals/Urinary Tract: Indeterminate exophytic 2.4 cm lesion off the upper pole of the right kidney. This is not definitively cystic on this unenhanced study. This warrants additional evaluation. Second of low-density area in the upper pole of the right kidney measures 15 mm and may represent a cyst but difficult to characterize on this unenhanced study. Small low-density area within the upper pole of the left kidney also difficult to  characterize. No hydronephrosis. Adrenal glands and urinary bladder unremarkable. No renal or ureteral stones. No hydronephrosis. Stomach/Bowel: Appendix is normal. Stomach, large and small bowel grossly unremarkable. Vascular/Lymphatic: Saccular partially calcified aneurysm off the left side of the aorta measures up to 3.6 cm. Aneurysmal dilatation of the left common iliac artery measuring up to 3.1 cm. No adenopathy. Reproductive: No visible focal abnormality. Other: No free fluid or free air. Musculoskeletal: No acute bony abnormality or focal bone lesion. 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 Recommend vascular surgical consultation. Indeterminate exophytic lesion off the upper pole of the right kidney, 2.4 cm. This warrants additional workup with contrast-enhanced CT or MRI. Other additional low-density areas in the kidneys may be cysts but recommend attention on additional imaging. Normal appendix. No renal or ureteral stones.  No hydronephrosis. Electronically Signed   By: Rolm Baptise M.D.   On: 05/25/2016 10:53   Ct Angio Abd/pel W/ And/or W/o  Result Date: 05/25/2016 CLINICAL DATA:  Presented to the ED with left groin pain. Abdominal aortic aneurysm. Renal lesion. EXAM: CTA ABDOMEN AND PELVIS WITH CONTRAST TECHNIQUE: Multidetector CT imaging of the abdomen and pelvis was performed using the standard protocol during bolus administration of intravenous contrast. Multiplanar reconstructed images and MIPs were obtained and reviewed to evaluate the vascular anatomy. CONTRAST:  100 mL Isovue 370 IV COMPARISON:  Earlier noncontrast scan of the same day FINDINGS: VASCULAR Aorta: Visualized distal descending thoracic and suprarenal segments unremarkable. There is a saccular aneurysm from the left lateral aspect of the infrarenal aorta measuring up to 4  cm maximum transverse diameter, extending across the bifurcation into the common iliac artery. There is some eccentric mural thrombus as well as slow flow within the lesion. There is some curvilinear calcification in the wall of the aneurysmal segment. No dissection flap. No significant stenosis. No adjacent retroperitoneal hemorrhage or inflammatory change. Celiac: Patent without evidence of aneurysm, dissection, vasculitis or significant stenosis. SMA: Patent without evidence of aneurysm, dissection, vasculitis or significant stenosis. Replaced right hepatic arterial supply, an anatomic variant. Renals: Both renal arteries are patent without evidence of aneurysm, dissection, vasculitis, fibromuscular dysplasia or significant stenosis. IMA: Patent, arising from the saccular aneurysm. Inflow: Aneurysmal dilatation of the left common iliac artery, contiguous with the saccular distal aortic aneurysm, measured up to a 3.6 cm maximum transverse diameter, extending to the common iliac bifurcation. There is some eccentric mural thrombus in the aneurysmal segment. Mild inflammatory changes in the adjacent retroperitoneal fat. No dissection flap. No stenosis. Left internal and external iliac arteries are unremarkable. Right iliac arterial system has some scattered calcified plaque, the common iliac ectatic up to 15 mm. Proximal Outflow: Bilateral common femoral and visualized portions of the superficial and profunda femoral arteries are patent without evidence of aneurysm, dissection, vasculitis or significant stenosis. Veins: Patent iliac venous system, IVC, bilateral renal veins, hepatic veins, portal vein, SMV, splenic  vein. Review of the MIP images confirms the above findings. NON-VASCULAR Lower chest: No acute abnormality. Hepatobiliary: No focal liver abnormality is seen. No gallstones, gallbladder wall thickening, or biliary dilatation. Pancreas: Unremarkable. No pancreatic ductal dilatation or surrounding  inflammatory changes. Spleen: Normal in size without focal abnormality. Adrenals/Urinary Tract: Normal adrenal glands. 2.7 cm enhancing partially exophytic upper pole right renal mass. Smaller bilateral renal cysts. No hydronephrosis. Urinary bladder is physiologically distended. Stomach/Bowel: Stomach is within normal limits. Appendix appears normal. No evidence of bowel wall thickening, distention, or inflammatory changes. Lymphatic: No adenopathy localized. Reproductive: Prostate is unremarkable. Other: No ascites.  No free air. Musculoskeletal: No acute or significant osseous findings. IMPRESSION: VASCULAR 1. 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 Findings may represent complications of infectious or autoimmune arteritis; atypical appearance for penetrating atheromatous ulcer or typical atherosclerotic change, with a paucity of disease elsewhere; no suggestion of dissection. The inflammatory changes around the common left common iliac artery along with regional pain suggest an acute exacerbation with elevated risk of rupture. Recommend vascular surgical consultation. NON-VASCULAR 1. 2.7 cm enhancing right upper pole renal mass suggesting renal cell carcinoma. Recommend urologic follow-up. Electronically Signed   By: Lucrezia Europe M.D.   On: 05/25/2016 13:29    Microbiology: Recent Results (from the past 240 hour(s))  MRSA PCR Screening     Status: None   Collection Time: 05/25/16  7:07 PM  Result Value Ref Range Status   MRSA by PCR NEGATIVE NEGATIVE Final    Comment:        The GeneXpert MRSA Assay (FDA approved for NASAL specimens only), is one component of a comprehensive MRSA colonization surveillance program. It is not intended to diagnose MRSA infection nor to guide or monitor treatment for MRSA infections.      Labs: Basic Metabolic Panel:  Recent Labs Lab 05/25/16 1005 05/26/16 1027  05/27/16 0357  NA 137 137 138  K 3.5 3.8 3.9  CL 101 102 104  CO2 27 23 23   GLUCOSE 94 122* 104*  BUN 12 10 11   CREATININE 0.90 1.21 0.97  CALCIUM 8.9 8.6* 8.9  MG  --  1.7 1.9   Liver Function Tests:  Recent Labs Lab 05/25/16 1005  AST 18  ALT 20  ALKPHOS 82  BILITOT 0.8  PROT 7.3  ALBUMIN 3.8   No results for input(s): LIPASE, AMYLASE in the last 168 hours. No results for input(s): AMMONIA in the last 168 hours. CBC:  Recent Labs Lab 05/25/16 1005 05/26/16 1027 05/27/16 0357  WBC 11.5* 9.8 12.8*  HGB 17.9* 15.8 16.4  HCT 50.0 44.5 46.5  MCV 78.4 79.0 78.4  PLT 175 172 175   Cardiac Enzymes: No results for input(s): CKTOTAL, CKMB, CKMBINDEX, TROPONINI in the last 168 hours. BNP: BNP (last 3 results) No results for input(s): BNP in the last 8760 hours.  ProBNP (last 3 results) No results for input(s): PROBNP in the last 8760 hours.  CBG: No results for input(s): GLUCAP in the last 168 hours.     Signed:  Dia Crawford, MD [facility] [phone] pager

## 2016-05-30 NOTE — Care Management (Signed)
CM received call from pt requesting FMLA papers to be filled out.  CM instructed pt to contact vascular surgeons office regarding FMLA papers  Pt informed CM that he does not currently have PCP.  CM provided pt health connect number to pt to find a [facility] doctor and also informed pt that he request insurance to assist with finding a PCP  Pt has already called [facility] and requested MD follow up appt

## 2016-05-31 ENCOUNTER — Encounter (HOSPITAL_COMMUNITY): Payer: Self-pay | Admitting: Vascular Surgery

## 2016-06-09 ENCOUNTER — Telehealth: Payer: Self-pay | Admitting: Vascular Surgery

## 2016-06-09 ENCOUNTER — Encounter: Payer: Self-pay | Admitting: Family Medicine

## 2016-06-09 ENCOUNTER — Ambulatory Visit: Payer: BLUE CROSS/BLUE SHIELD | Attending: Family Medicine | Admitting: Family Medicine

## 2016-06-09 ENCOUNTER — Other Ambulatory Visit: Payer: Self-pay

## 2016-06-09 VITALS — BP 133/91 | HR 76 | Temp 98.6°F | Ht 66.0 in | Wt 203.2 lb

## 2016-06-09 DIAGNOSIS — I5032 Chronic diastolic (congestive) heart failure: Secondary | ICD-10-CM | POA: Diagnosis not present

## 2016-06-09 DIAGNOSIS — N2889 Other specified disorders of kidney and ureter: Secondary | ICD-10-CM | POA: Diagnosis not present

## 2016-06-09 DIAGNOSIS — Z9889 Other specified postprocedural states: Secondary | ICD-10-CM | POA: Diagnosis not present

## 2016-06-09 DIAGNOSIS — I714 Abdominal aortic aneurysm, without rupture, unspecified: Secondary | ICD-10-CM

## 2016-06-09 DIAGNOSIS — I671 Cerebral aneurysm, nonruptured: Secondary | ICD-10-CM

## 2016-06-09 DIAGNOSIS — I110 Hypertensive heart disease with heart failure: Secondary | ICD-10-CM | POA: Insufficient documentation

## 2016-06-09 DIAGNOSIS — I729 Aneurysm of unspecified site: Secondary | ICD-10-CM | POA: Diagnosis present

## 2016-06-09 DIAGNOSIS — I10 Essential (primary) hypertension: Secondary | ICD-10-CM | POA: Insufficient documentation

## 2016-06-09 DIAGNOSIS — Z8249 Family history of ischemic heart disease and other diseases of the circulatory system: Secondary | ICD-10-CM

## 2016-06-09 DIAGNOSIS — I509 Heart failure, unspecified: Secondary | ICD-10-CM | POA: Insufficient documentation

## 2016-06-09 DIAGNOSIS — I719 Aortic aneurysm of unspecified site, without rupture: Secondary | ICD-10-CM | POA: Diagnosis not present

## 2016-06-09 DIAGNOSIS — Z79899 Other long term (current) drug therapy: Secondary | ICD-10-CM | POA: Diagnosis not present

## 2016-06-09 DIAGNOSIS — Z8679 Personal history of other diseases of the circulatory system: Secondary | ICD-10-CM

## 2016-06-09 MED ORDER — LISINOPRIL 20 MG PO TABS: 20.0000 mg | ORAL_TABLET | Freq: Every day | ORAL | 5 refills | Status: DC

## 2016-06-09 MED ORDER — METOPROLOL TARTRATE 50 MG PO TABS: 50.0000 mg | ORAL_TABLET | Freq: Two times a day (BID) | ORAL | 5 refills | Status: DC

## 2016-06-09 NOTE — Telephone Encounter (Signed)
I spoke w/ the patient and gave him specific detailed instructions for his CTA appt at [address] [facility] on 06/22/16 at 11:20am. He verbalized understanding that he is to arrive at 11am and also no solid foods 4 hours prior. He is aware of the post op appt w/ [facility] on 1/31/8 at 1:30pm here at [facility].  I gave him our office addres and phone number, as well as the address and phone number for [facility]. Mailed patient information to him on 06/09/16. awt

## 2016-06-09 NOTE — Telephone Encounter (Signed)
Called patient and left voice mail message regarding the 4 wk post op appt w/ CTA prior.  I have an appt for the CTA scheduled on 06/22/16 at 11:20am at [address] [facility]. He should arrive at 11am. He is scheduled to see Dr.Dickson on 06/28/16 at 1:30pm. I will also mail this appt information to the patient. awt

## 2016-06-09 NOTE — Telephone Encounter (Signed)
-----   Message from Denman George, RN sent at 06/09/2016  1:01 PM EST ----- Regarding: RE: Appt Scheduling Question This pt would need a 4 week f/u with CTA Abd/ pelvis with Dr. Scot Dock  ----- Message ----- From: Lujean Amel Sent: 06/09/2016  12:49 PM To: [facility] Clinical Pool Subject: Appt Scheduling Question                       Hello ladies! This patient is in our referral workque and Dr.Amao is requesting Dr.Dickson to follow up with this patient. I did not see any staff messages for this gentleman. He did have surgery for AAA repair on 05/26/16 and I am sure he does need a post op visit. Should he have any studies prior or imaging? Please advise and I will contact the patient to schedule. Thanks, Anne Ng

## 2016-06-09 NOTE — Patient Instructions (Addendum)
Heart Failure Heart failure is a condition in which the heart has trouble pumping blood because it has become weak or stiff. This means that the heart does not pump blood efficiently for the body to work well. For some people with heart failure, fluid may back up into the lungs and there may be swelling (edema) in the lower legs. Heart failure is usually a long-term (chronic) condition. It is important for you to take good care of yourself and follow the treatment plan from your health care provider. What are the causes? This condition is caused by some health problems, including:  High blood pressure (hypertension). Hypertension causes the heart muscle to work harder than normal. High blood pressure eventually causes the heart to become stiff and weak.  Coronary artery disease (CAD). CAD is the buildup of cholesterol and fat (plaques) in the arteries of the heart.  Heart attack (myocardial infarction). Injured tissue, which is caused by the heart attack, does not contract as well and the heart's ability to pump blood is weakened.  Abnormal heart valves. When the heart valves do not open and close properly, the heart muscle must pump harder to keep the blood flowing.  Heart muscle disease (cardiomyopathy or myocarditis). Heart muscle disease is damage to the heart muscle from a variety of causes, such as drug or alcohol abuse, infections, or unknown causes. These can increase the risk of heart failure.  Lung disease. When the lungs do not work properly, the heart must work harder. What increases the risk? Risk of heart failure increases as a person ages. This condition is also more likely to develop in people who:  Are overweight.  Are male.  Smoke or chew tobacco.  Abuse alcohol or illegal drugs.  Have taken medicines that can damage the heart, such as chemotherapy drugs.  Have diabetes.  High blood sugar (glucose) is associated with high fat (lipid) levels in the blood.  Diabetes  can also damage tiny blood vessels that carry nutrients to the heart muscle.  Have abnormal heart rhythms.  Have thyroid problems.  Have low blood counts (anemia). What are the signs or symptoms? Symptoms of this condition include:  Shortness of breath with activity, such as when climbing stairs.  Persistent cough.  Swelling of the feet, ankles, legs, or abdomen.  Unexplained weight gain.  Difficulty breathing when lying flat (orthopnea).  Waking from sleep because of the need to sit up and get more air.  Rapid heartbeat.  Fatigue and loss of energy.  Feeling light-headed, dizzy, or close to fainting.  Loss of appetite.  Nausea.  Increased urination during the night (nocturia).  Confusion. How is this diagnosed? This condition is diagnosed based on:  Medical history, symptoms, and a physical exam.  Diagnostic tests, which may include:  Echocardiogram.  Electrocardiogram (ECG).  Chest X-ray.  Blood tests.  Exercise stress test.  Radionuclide scans.  Cardiac catheterization and angiogram. How is this treated? Treatment for this condition is aimed at managing the symptoms of heart failure. Medicines, behavioral changes, or other treatments may be necessary to treat heart failure. Medicines  These may include:  Angiotensin-converting enzyme (ACE) inhibitors. This type of medicine blocks the effects of a blood protein called angiotensin-converting enzyme. ACE inhibitors relax (dilate) the blood vessels and help to lower blood pressure.  Angiotensin receptor blockers (ARBs). This type of medicine blocks the actions of a blood protein called angiotensin. ARBs dilate the blood vessels and help to lower blood pressure.  Water pills (diuretics). Diuretics  cause the kidneys to remove salt and water from the blood. The extra fluid is removed through urination, leaving a lower volume of blood that the heart has to pump.  Beta blockers. These improve heart muscle  strength and they prevent the heart from beating too quickly.  Digoxin. This increases the force of the heartbeat. Healthy behavior changes  These may include:  Reaching and maintaining a healthy weight.  Stopping smoking or chewing tobacco.  Eating heart-healthy foods.  Limiting or avoiding alcohol.  Stopping use of street drugs (illegal drugs).  Physical activity. Other treatments  These may include:  Surgery to open blocked coronary arteries or repair damaged heart valves.  Placement of a biventricular pacemaker to improve heart muscle function (cardiac resynchronization therapy). This device paces both the right ventricle and left ventricle.  Placement of a device to treat serious abnormal heart rhythms (implantable cardioverter defibrillator, or ICD).  Placement of a device to improve the pumping ability of the heart (left ventricular assist device, or LVAD).  Heart transplant. This can cure heart failure, and it is considered for certain patients who do not improve with other therapies. Follow these instructions at home: Medicines  Take over-the-counter and prescription medicines only as told by your health care provider. Medicines are important in reducing the workload of your heart, slowing the progression of heart failure, and improving your symptoms.  Do not stop taking your medicine unless your health care provider told you to do that.  Do not skip any dose of medicine.  Refill your prescriptions before you run out of medicine. You need your medicines every day. Eating and drinking  Eat heart-healthy foods. Talk with a dietitian to make an eating plan that is right for you.  Choose foods that contain no trans fat and are low in saturated fat and cholesterol. Healthy choices include fresh or frozen fruits and vegetables, fish, lean meats, legumes, fat-free or low-fat dairy products, and whole-grain or high-fiber foods.  Limit salt (sodium) if directed by your  health care provider. Sodium restriction may reduce symptoms of heart failure. Ask a dietitian to recommend heart-healthy seasonings.  Use healthy cooking methods instead of frying. Healthy methods include roasting, grilling, broiling, baking, poaching, steaming, and stir-frying.  Limit your fluid intake if directed by your health care provider. Fluid restriction may reduce symptoms of heart failure. Lifestyle  Stop smoking or using chewing tobacco. Nicotine and tobacco can damage your heart and your blood vessels. Do not use nicotine gum or patches before talking to your health care provider.  Limit alcohol intake to no more than 1 drink per day for non-pregnant women and 2 drinks per day for men. One drink equals 12 oz of beer, 5 oz of wine, or 1 oz of hard liquor.  Drinking more than that is harmful to your heart. Tell your health care provider if you drink alcohol several times a week.  Talk with your health care provider about whether any level of alcohol use is safe for you.  If your heart has already been damaged by alcohol or you have severe heart failure, drinking alcohol should be stopped completely.  Stop use of illegal drugs.  Lose weight if directed by your health care provider. Weight loss may reduce symptoms of heart failure.  Do moderate physical activity if directed by your health care provider. People who are elderly and people with severe heart failure should consult with a health care provider for physical activity recommendations. Monitor important information  Weigh  yourself every day. Keeping track of your weight daily helps you to notice excess fluid sooner.  Weigh yourself every morning after you urinate and before you eat breakfast.  Wear the same amount of clothing each time you weigh yourself.  Record your daily weight. Provide your health care provider with your weight record.  Monitor and record your blood pressure as told by your health care  provider.  Check your pulse as told by your health care provider. Dealing with extreme temperatures  If the weather is extremely hot:  Avoid vigorous physical activity.  Use air conditioning or fans or seek a cooler location.  Avoid caffeine and alcohol.  Wear loose-fitting, lightweight, and light-colored clothing.  If the weather is extremely cold:  Avoid vigorous physical activity.  Layer your clothes.  Wear mittens or gloves, a hat, and a scarf when you go outside.  Avoid alcohol. General instructions  Manage other health conditions such as hypertension, diabetes, thyroid disease, or abnormal heart rhythms as told by your health care provider.  Learn to manage stress. If you need help to do this, ask your health care provider.  Plan rest periods when fatigued.  Get ongoing education and support as needed.  Participate in or seek rehabilitation as needed to maintain or improve independence and quality of life.  Stay up to date with immunizations. Keeping current on pneumococcal and influenza immunizations is especially important to prevent respiratory infections.  Keep all follow-up visits as told by your health care provider. This is important. Contact a health care provider if:  You have a rapid weight gain.  You have increasing shortness of breath that is unusual for you.  You are unable to participate in your usual physical activities.  You tire easily.  You cough more than normal, especially with physical activity.  You have any swelling or more swelling in areas such as your hands, feet, ankles, or abdomen.  You are unable to sleep because it is hard to breathe.  You feel like your heart is beating quickly (palpitations).  You become dizzy or light-headed when you stand up. Get help right away if:  You have difficulty breathing.  You notice or your family notices a change in your awareness, such as having trouble staying awake or having difficulty  with concentration.  You have pain or discomfort in your chest.  You have an episode of fainting (syncope). This information is not intended to replace advice given to you by your health care provider. Make sure you discuss any questions you have with your health care provider. Document Released: 05/15/2005 Document Revised: 01/18/2016 Document Reviewed: 12/08/2015 Elsevier Interactive Patient Education  2017 [place] Introduction Heart-healthy meal planning includes:  Limiting unhealthy fats.  Increasing healthy fats.  Making other small dietary changes. You may need to talk with your doctor or a diet specialist (dietitian) to create an eating plan that is right for you. What types of fat should I choose?  Choose healthy fats. These include olive oil and canola oil, flaxseeds, walnuts, almonds, and seeds.  Eat more omega-3 fats. These include salmon, mackerel, sardines, tuna, flaxseed oil, and ground flaxseeds. Try to eat fish at least twice each week.  Limit saturated fats.  Saturated fats are often found in animal products, such as meats, butter, and cream.  Plant sources of saturated fats include palm oil, palm kernel oil, and coconut oil.  Avoid foods with partially hydrogenated oils in them. These include stick margarine, some  tub margarines, cookies, crackers, and other baked goods. These contain trans fats. What general guidelines do I need to follow?  Check food labels carefully. Identify foods with trans fats or high amounts of saturated fat.  Fill one half of your plate with vegetables and green salads. Eat 4-5 servings of vegetables per day. A serving of vegetables is:  1 cup of raw leafy vegetables.   cup of raw or cooked cut-up vegetables.   cup of vegetable juice.  Fill one fourth of your plate with whole grains. Look for the word "whole" as the first word in the ingredient list.  Fill one fourth of your plate with lean  protein foods.  Eat 4-5 servings of fruit per day. A serving of fruit is:  One medium whole fruit.   cup of dried fruit.   cup of fresh, frozen, or canned fruit.   cup of 100% fruit juice.  Eat more foods that contain soluble fiber. These include apples, broccoli, carrots, beans, peas, and barley. Try to get 20-30 g of fiber per day.  Eat more home-cooked food. Eat less restaurant, buffet, and fast food.  Limit or avoid alcohol.  Limit foods high in starch and sugar.  Avoid fried foods.  Avoid frying your food. Try baking, boiling, grilling, or broiling it instead. You can also reduce fat by:  Removing the skin from poultry.  Removing all visible fats from meats.  Skimming the fat off of stews, soups, and gravies before serving them.  Steaming vegetables in water or broth.  Lose weight if you are overweight.  Eat 4-5 servings of nuts, legumes, and seeds per week:  One serving of dried beans or legumes equals  cup after being cooked.  One serving of nuts equals 1 ounces.  One serving of seeds equals  ounce or one tablespoon.  You may need to keep track of how much salt or sodium you eat. This is especially true if you have high blood pressure. Talk with your doctor or dietitian to get more information. What foods can I eat? Grains  Breads, including [place], white, pita, wheat, raisin, rye, oatmeal, and [place]. Tortillas that are neither fried nor made with lard or trans fat. Low-fat rolls, including hotdog and hamburger buns and English muffins. Biscuits. Muffins. Waffles. Pancakes. Light popcorn. Whole-grain cereals. Flatbread. Melba toast. Pretzels. Breadsticks. Rusks. Low-fat snacks. Low-fat crackers, including oyster, saltine, matzo, graham, animal, and rye. Rice and pasta, including brown rice and pastas that are made with whole wheat. Vegetables  All vegetables. Fruits  All fruits, but limit coconut. Meats and Other Protein Sources  Lean, well-trimmed  beef, veal, pork, and lamb. Chicken and [place] without skin. All fish and shellfish. Wild duck, rabbit, pheasant, and venison. Egg whites or low-cholesterol egg substitutes. Dried beans, peas, lentils, and tofu. Seeds and most nuts. Dairy  Low-fat or nonfat cheeses, including ricotta, string, and mozzarella. Skim or 1% milk that is liquid, powdered, or evaporated. Buttermilk that is made with low-fat milk. Nonfat or low-fat yogurt. Beverages  Mineral water. Diet carbonated beverages. Sweets and Desserts  Sherbets and fruit ices. Honey, jam, marmalade, jelly, and syrups. Meringues and gelatins. Pure sugar candy, such as hard candy, jelly beans, gumdrops, mints, marshmallows, and small amounts of dark chocolate. [employer] Eat all sweets and desserts in moderation. Fats and Oils  Nonhydrogenated (trans-free) margarines. Vegetable oils, including soybean, sesame, sunflower, olive, peanut, safflower, corn, canola, and cottonseed. Salad dressings or mayonnaise made with a vegetable oil. Limit  added fats and oils that you use for cooking, baking, salads, and as spreads. Other  Cocoa powder. Coffee and tea. All seasonings and condiments. The items listed above may not be a complete list of recommended foods or beverages. Contact your dietitian for more options.  What foods are not recommended? Grains  Breads that are made with saturated or trans fats, oils, or whole milk. Croissants. Butter rolls. Cheese breads. Sweet rolls. Donuts. Buttered popcorn. Chow mein noodles. High-fat crackers, such as cheese or butter crackers. Meats and Other Protein Sources  Fatty meats, such as hotdogs, short ribs, sausage, spareribs, bacon, rib eye roast or steak, and mutton. High-fat deli meats, such as salami and bologna. Caviar. Domestic duck and goose. Organ meats, such as kidney, liver, sweetbreads, and heart. Dairy  Cream, sour cream, cream cheese, and creamed cottage cheese. Whole-milk cheeses, including blue  (bleu), Monterey Jack, [place], [place], American, [place], Swiss, cheddar, [place], and [place]. Whole or 2% milk that is liquid, evaporated, or condensed. Whole buttermilk. Cream sauce or high-fat cheese sauce. Yogurt that is made from whole milk. Beverages  Regular sodas and juice drinks with added sugar. Sweets and Desserts  Frosting. Pudding. Cookies. Cakes other than angel food cake. Candy that has milk chocolate or white chocolate, hydrogenated fat, butter, coconut, or unknown ingredients. Buttered syrups. Full-fat ice cream or ice cream drinks. Fats and Oils  Gravy that has suet, meat fat, or shortening. Cocoa butter, hydrogenated oils, palm oil, coconut oil, palm kernel oil. These can often be found in baked products, candy, fried foods, nondairy creamers, and whipped toppings. Solid fats and shortenings, including bacon fat, salt pork, lard, and butter. Nondairy cream substitutes, such as coffee creamers and sour cream substitutes. Salad dressings that are made of unknown oils, cheese, or sour cream. The items listed above may not be a complete list of foods and beverages to avoid. Contact your dietitian for more information.  This information is not intended to replace advice given to you by your health care provider. Make sure you discuss any questions you have with your health care provider. Document Released: 11/14/2011 Document Revised: 10/21/2015 Document Reviewed: 11/06/2013  2017 [employer]

## 2016-06-09 NOTE — Progress Notes (Signed)
Subjective:  Patient ID: Nicholas Larson, male    DOB: 1981/01/21  Age: 36 y.o. 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  He underwent aneurysm repair and embolization by vascular surgery on 05/26/16. CT of the abdomen and pelvis also revealed 2.7 cm upper pole enhancing right renal mass suggestive of malignancy. He had a 2-D echo which revealed ejection fraction of 123456, grade 2 diastolic dysfunction, no regional wall motion abnormalities. During his hospitalization he was commenced on antihypertensives. Postoperative course was uneventful and he was subsequently discharged.  He has no acute concerns today and denies chest pains, shortness of breath or pedal edema. He does not have follow-up appointments with vascular surgery or urology.  Past Medical History:  Diagnosis Date  . CHF (congestive heart failure) ([place])   . Hypertension     Past Surgical History:  Procedure Laterality Date  . ABDOMINAL AORTIC ENDOVASCULAR STENT GRAFT N/A 05/26/2016   Procedure: ABDOMINAL AORTIC ENDOVASCULAR STENT GRAFT;  Surgeon: Angelia Mould, MD;  Location: [facility];  Service: Vascular;  Laterality: N/A;  . EMBOLIZATION Left 05/26/2016   Procedure: COIL EMBOLIZATION LEFT HYPOGASTRIC ARTERY;  Surgeon: Angelia Mould, MD;  Location: [facility];  Service: Vascular;  Laterality: Left;    No Known Allergies   Outpatient Medications Prior to Visit  Medication Sig Dispense Refill  . lisinopril (PRINIVIL,ZESTRIL) 20 MG tablet Take 1 tablet (20 mg total) by mouth daily. 30 tablet 0  . metoprolol  (LOPRESSOR) 50 MG tablet Take 1 tablet (50 mg total) by mouth 2 (two) times daily. 60 tablet 0   No facility-administered medications prior to visit.     ROS Review of Systems  Constitutional: Negative for activity change and appetite change.  HENT: Negative for sinus pressure and sore throat.   Eyes: Negative for visual disturbance.  Respiratory: Negative for cough, chest tightness and shortness of breath.   Cardiovascular: Negative for chest pain and leg swelling.  Gastrointestinal: Negative for abdominal distention, abdominal pain, constipation and diarrhea.  Endocrine: Negative.   Genitourinary: Negative for dysuria.  Musculoskeletal: Negative for joint swelling and myalgias.  Skin: Negative for rash.  Allergic/Immunologic: Negative.   Neurological: Negative for weakness, light-headedness and numbness.  Psychiatric/Behavioral: Negative for dysphoric mood and suicidal ideas.    Objective:  BP (!) 133/91 (BP Location: Right Arm, Patient Position: Sitting, Cuff Size: Large)   Pulse 76   Temp 98.6 F (37 C) (Oral)   Ht 5\' 6"  (1.676 m)   Wt 203 lb 3.2 oz (92.2 kg)   SpO2 96%   BMI 32.80 kg/m   BP/Weight 06/09/2016 05/28/2016 99991111  Systolic BP Q000111Q 0000000 Q000111Q  Diastolic BP 91 98 98  Wt. (Lbs) 203.2 200.4 166  BMI 32.8 30.47 28.48      Physical Exam  Constitutional: He is oriented to person, place, and time. He appears well-developed and well-nourished.  Neck: No JVD present.  Cardiovascular: Normal rate, normal heart sounds and intact distal pulses.   No murmur heard. Pulmonary/Chest: Effort normal and breath sounds normal. He has no wheezes. He has no rales. He exhibits no tenderness.  Abdominal: Soft. Bowel sounds are normal. He exhibits no distension and no mass. There is no tenderness.  Musculoskeletal: Normal range of motion.  Neurological: He is alert and oriented to person, place, and time.  Skin: Skin is warm and dry.  Psychiatric: He has a normal mood and  affect.      CLINICAL DATA:  Presented to the ED with left groin pain. Abdominal aortic aneurysm. Renal lesion.  EXAM: CTA ABDOMEN AND PELVIS WITH CONTRAST  TECHNIQUE: Multidetector CT imaging of the abdomen and pelvis was performed using the standard protocol during bolus administration of intravenous contrast. Multiplanar reconstructed images and MIPs were obtained and reviewed to evaluate the vascular anatomy.  CONTRAST:  100 mL Isovue 370 IV  COMPARISON:  Earlier noncontrast scan of the same day  FINDINGS: VASCULAR  Aorta: Visualized distal descending thoracic and suprarenal segments unremarkable. There is a saccular aneurysm from the left lateral aspect of the infrarenal aorta measuring up to 4 cm maximum transverse diameter, extending across the bifurcation into the common iliac artery. There is some eccentric mural thrombus as well as slow flow within the lesion. There is some curvilinear calcification in the wall of the aneurysmal segment. No dissection flap. No significant stenosis. No adjacent retroperitoneal hemorrhage or inflammatory change.  Celiac: Patent without evidence of aneurysm, dissection, vasculitis or significant stenosis.  SMA: Patent without evidence of aneurysm, dissection, vasculitis or significant stenosis. Replaced right hepatic arterial supply, an anatomic variant.  Renals: Both renal arteries are patent without evidence of aneurysm, dissection, vasculitis, fibromuscular dysplasia or significant stenosis.  IMA: Patent, arising from the saccular aneurysm.  Inflow: Aneurysmal dilatation of the left common iliac artery, contiguous with the saccular distal aortic aneurysm, measured up to a 3.6 cm maximum transverse diameter, extending to the common iliac bifurcation. There is some eccentric mural thrombus in the aneurysmal segment. Mild inflammatory changes in the adjacent retroperitoneal fat. No dissection flap. No stenosis.  Left internal and external iliac arteries are unremarkable. Right iliac arterial system has some scattered calcified plaque, the common iliac ectatic up to 15 mm.  Proximal Outflow: Bilateral common femoral and visualized portions of the superficial and profunda femoral arteries are patent without evidence of aneurysm, dissection, vasculitis or significant stenosis.  Veins: Patent iliac venous system, IVC, bilateral renal veins, hepatic veins, portal vein, SMV, splenic vein.  Review of the MIP images confirms the above findings.  NON-VASCULAR  Lower chest: No acute abnormality.  Hepatobiliary: No focal liver abnormality is seen. No gallstones, gallbladder wall thickening, or biliary dilatation.  Pancreas: Unremarkable. No pancreatic ductal dilatation or surrounding inflammatory changes.  Spleen: Normal in size without focal abnormality.  Adrenals/Urinary Tract: Normal adrenal glands. 2.7 cm enhancing partially exophytic upper pole right renal mass. Smaller bilateral renal cysts. No hydronephrosis. Urinary bladder is physiologically distended.  Stomach/Bowel: Stomach is within normal limits. Appendix appears normal. No evidence of bowel wall thickening, distention, or inflammatory changes.  Lymphatic: No adenopathy localized.  Reproductive: Prostate is unremarkable.  Other: No ascites.  No free air.  Musculoskeletal: No acute or significant osseous findings.  IMPRESSION: VASCULAR  1. 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 Findings may represent complications of infectious or autoimmune arteritis; atypical appearance for penetrating atheromatous ulcer or typical atherosclerotic change, with a paucity of disease elsewhere; no suggestion of dissection. The inflammatory changes around the common left common iliac artery along with regional pain suggest an acute  exacerbation with  elevated risk of rupture. Recommend vascular surgical consultation.  NON-VASCULAR  1. 2.7 cm enhancing right upper pole renal mass suggesting renal cell carcinoma. Recommend urologic follow-up.   Electronically Signed   By: Lucrezia Europe M.D.   On: 05/25/2016 13:29  Assessment & Plan:   1. Saccular aneurysm - Ambulatory referral to Vascular Surgery - MR Angiogram Head Wo Contrast; Future  2. Right renal mass - Ambulatory referral to Urology  3. Chronic diastolic congestive heart failure (HCC) EF 123456, grade 2 diastolic dysfunction from 2-D echo 04/2016 Discussed heart healthy diet, limiting fluid intake to less than 2 L per day, daily weight checks We'll refer to cardiology in the setting of atrial septal aneurysm - Ambulatory referral to Cardiology  4. Status post percutaneous abdominal aortic aneurysm (AAA) repair - Ambulatory referral to Vascular Surgery - MR Angiogram Head Wo Contrast; Future  5. Family history of cerebral aneurysm - MR Angiogram Head Wo Contrast; Future  6. Essential hypertension Slight diastolic elevation Continue metoprolol and lisinopril Low-sodium diet - metoprolol (LOPRESSOR) 50 MG tablet; Take 1 tablet (50 mg total) by mouth 2 (two) times daily.  Dispense: 60 tablet; Refill: 5 - lisinopril (PRINIVIL,ZESTRIL) 20 MG tablet; Take 1 tablet (20 mg total) by mouth daily.  Dispense: 30 tablet; Refill: 5   Meds ordered this encounter  Medications  . metoprolol (LOPRESSOR) 50 MG tablet    Sig: Take 1 tablet (50 mg total) by mouth 2 (two) times daily.    Dispense:  60 tablet    Refill:  5  . lisinopril (PRINIVIL,ZESTRIL) 20 MG tablet    Sig: Take 1 tablet (20 mg total) by mouth daily.    Dispense:  30 tablet    Refill:  5    Follow-up: Return in about 1 month (around 07/10/2016) for Follow-up on hypertension and coordination of care.   Arnoldo Morale MD

## 2016-06-14 ENCOUNTER — Ambulatory Visit (HOSPITAL_COMMUNITY): Admission: RE | Admit: 2016-06-14 | Payer: BLUE CROSS/BLUE SHIELD | Source: Ambulatory Visit

## 2016-06-19 ENCOUNTER — Ambulatory Visit (HOSPITAL_COMMUNITY)
Admission: RE | Admit: 2016-06-19 | Discharge: 2016-06-19 | Disposition: A | Discharge status: Home / Self Care | Payer: BLUE CROSS/BLUE SHIELD | Source: Ambulatory Visit | Attending: Family Medicine | Admitting: Family Medicine

## 2016-06-19 DIAGNOSIS — I671 Cerebral aneurysm, nonruptured: Secondary | ICD-10-CM

## 2016-06-19 DIAGNOSIS — Z9889 Other specified postprocedural states: Secondary | ICD-10-CM | POA: Diagnosis not present

## 2016-06-19 DIAGNOSIS — Z8679 Personal history of other diseases of the circulatory system: Secondary | ICD-10-CM | POA: Insufficient documentation

## 2016-06-19 DIAGNOSIS — Z8249 Family history of ischemic heart disease and other diseases of the circulatory system: Secondary | ICD-10-CM | POA: Insufficient documentation

## 2016-06-21 ENCOUNTER — Telehealth: Payer: Self-pay | Admitting: Family Medicine

## 2016-06-21 NOTE — Telephone Encounter (Signed)
Patient called the office to speak with nurse regarding the results of his MRI that was done a couple of days ago. Please follow up.  Thank you.

## 2016-06-21 NOTE — Progress Notes (Deleted)
Cardiology Office Note    Date:  06/21/2016   ID:  Nicholas Larson, DOB 27-Jun-1980, MRN [id number]  PCP:  Arnoldo Morale, MD  Cardiologist:  New  Chief Complaint: Chronic diastolic CHF  History of Present Illness:   Nicholas Larson is a 35 y.o. male ***   Past Medical History:  Diagnosis Date  . CHF (congestive heart failure) ([place])   . Hypertension     Past Surgical History:  Procedure Laterality Date  . ABDOMINAL AORTIC ENDOVASCULAR STENT GRAFT N/A 05/26/2016   Procedure: ABDOMINAL AORTIC ENDOVASCULAR STENT GRAFT;  Surgeon: Angelia Mould, MD;  Location: [facility];  Service: Vascular;  Laterality: N/A;  . EMBOLIZATION Left 05/26/2016   Procedure: COIL EMBOLIZATION LEFT HYPOGASTRIC ARTERY;  Surgeon: Angelia Mould, MD;  Location: [facility];  Service: Vascular;  Laterality: Left;    Current Medications: Prior to Admission medications   Medication Sig Start Date End Date Taking? Authorizing Provider  lisinopril (PRINIVIL,ZESTRIL) 20 MG tablet Take 1 tablet (20 mg total) by mouth daily. 06/09/16   Arnoldo Morale, MD  metoprolol (LOPRESSOR) 50 MG tablet Take 1 tablet (50 mg total) by mouth 2 (two) times daily. 06/09/16   Arnoldo Morale, MD    Allergies:   Patient has no known allergies.   Social History   Social History  . Marital status: Divorced    Spouse name: N/A  . Number of children: N/A  . Years of education: N/A   Social History Main Topics  . Smoking status: Former Smoker    Packs/day: 1.00    Types: Cigarettes  . Smokeless tobacco: Never Used  . Alcohol use No     Comment: last drink 01/2016  . Drug use: No  . Sexual activity: Not on file   Other Topics Concern  . Not on file   Social History Narrative  . No narrative on file     Family History:  The patient's family history includes Aneurysm in his father; Hypertension in his mother. ***  ROS:   Please see the history of present illness.    ROS All other systems reviewed and are  negative.   PHYSICAL EXAM:   VS:  There were no vitals taken for this visit.   GEN: Well nourished, well developed, in no acute distress HEENT: normal Neck: no JVD, carotid bruits, or masses Cardiac: ***RRR; no murmurs, rubs, or gallops,no edema  Respiratory:  clear to auscultation bilaterally, normal work of breathing GI: soft, nontender, nondistended, + BS MS: no deformity or atrophy Skin: warm and dry, no rash Neuro:  Alert and Oriented x 3, Strength and sensation are intact Psych: euthymic mood, full affect  Wt Readings from Last 3 Encounters:  06/09/16 203 lb 3.2 oz (92.2 kg)  05/28/16 200 lb 6.4 oz (90.9 kg)  06/22/15 166 lb (75.3 kg)      Studies/Labs Reviewed:   EKG:  EKG is ordered today.  The ekg ordered today demonstrates ***  Recent Labs: 05/25/2016: ALT 20 05/27/2016: BUN 11; Creatinine, Ser 0.97; Hemoglobin 16.4; Magnesium 1.9; Platelets 175; Potassium 3.9; Sodium 138   Lipid Panel    Component Value Date/Time   CHOL 161 01/22/2015 1212   TRIG 125 01/22/2015 1212   HDL 39 (L) 01/22/2015 1212   CHOLHDL 4.1 01/22/2015 1212   VLDL 25 01/22/2015 1212   LDLCALC 97 01/22/2015 1212    Additional studies/ records that were reviewed today include:   Echocardiogram:  Cardiac Catheterization:     ASSESSMENT &  PLAN:    1. ***    Medication Adjustments/Labs and Tests Ordered: Current medicines are reviewed at length with the patient today.  Concerns regarding medicines are outlined above.  Medication changes, Labs and Tests ordered today are listed in the Patient Instructions below. There are no Patient Instructions on file for this visit.   Jarrett Soho, [place]  06/21/2016 11:15 AM    [address], [place], [place]  [address] Phone: [phone]; Fax: [phone]

## 2016-06-22 ENCOUNTER — Ambulatory Visit
Admission: RE | Admit: 2016-06-22 | Discharge: 2016-06-22 | Disposition: A | Discharge status: Home / Self Care | Payer: BLUE CROSS/BLUE SHIELD | Source: Ambulatory Visit | Attending: Vascular Surgery | Admitting: Vascular Surgery

## 2016-06-22 ENCOUNTER — Telehealth: Payer: Self-pay

## 2016-06-22 DIAGNOSIS — I714 Abdominal aortic aneurysm, without rupture, unspecified: Secondary | ICD-10-CM

## 2016-06-22 MED ORDER — IOPAMIDOL (ISOVUE-370) INJECTION 76%
75.0000 mL | Freq: Once | INTRAVENOUS | Status: AC | PRN
  Administered 2016-06-22: 75 mL via INTRAVENOUS

## 2016-06-22 NOTE — Telephone Encounter (Signed)
Writer called patient and discussed MRI results.  Patient stated understanding.

## 2016-06-22 NOTE — Telephone Encounter (Signed)
I recorded a result note on 06/20/16. Please look at your in basket

## 2016-06-22 NOTE — Telephone Encounter (Signed)
-----   Message from Arnoldo Morale, MD sent at 06/20/2016  8:40 AM EST ----- MRA of the head is negative for brain aneurysm.

## 2016-06-26 ENCOUNTER — Ambulatory Visit: Payer: BLUE CROSS/BLUE SHIELD | Admitting: Physician Assistant

## 2016-06-26 ENCOUNTER — Encounter: Payer: Self-pay | Admitting: Vascular Surgery

## 2016-06-28 ENCOUNTER — Ambulatory Visit (INDEPENDENT_AMBULATORY_CARE_PROVIDER_SITE_OTHER): Payer: Self-pay | Admitting: Vascular Surgery

## 2016-06-28 ENCOUNTER — Encounter: Payer: Self-pay | Admitting: Vascular Surgery

## 2016-06-28 ENCOUNTER — Other Ambulatory Visit: Payer: Self-pay | Admitting: Urology

## 2016-06-28 VITALS — BP 151/105 | HR 61 | Temp 98.7°F | Resp 16 | Ht 66.0 in | Wt 212.0 lb

## 2016-06-28 DIAGNOSIS — Z48812 Encounter for surgical aftercare following surgery on the circulatory system: Secondary | ICD-10-CM

## 2016-06-28 DIAGNOSIS — D4101 Neoplasm of uncertain behavior of right kidney: Secondary | ICD-10-CM

## 2016-06-28 NOTE — Progress Notes (Signed)
   Patient name: Nicholas Larson MRN: [id number] DOB: 1980-12-09 Sex: male  REASON FOR VISIT: Follow up after percutaneous endovascular aneurysm repair.  HPI: Nicholas Larson is a 36 y.o. male who presented with the acute onset of left groin pain and significant hypertension. His workup included a CT scan which showed a very unusual saccular aneurysm off of the left lateral wall of the infrarenal aorta extending down to the left common iliac artery. I felt that the aneurysm was likely the source of his pain and therefore he underwent semiurgent endovascular aneurysm repair. Of note his pain resolved once his blood pressure was better controlled.  On 05/26/2016 he underwent percutaneous endovascular aneurysm repair using 3 components and coil embolization of the left hypogastric artery. The patient returns for his first follow up visit. He has no specific complaints. He denies claudication or rest pain. He is not a smoker.  Current Outpatient Prescriptions  Medication Sig Dispense Refill  . lisinopril (PRINIVIL,ZESTRIL) 20 MG tablet Take 1 tablet (20 mg total) by mouth daily. 30 tablet 5  . metoprolol (LOPRESSOR) 50 MG tablet Take 1 tablet (50 mg total) by mouth 2 (two) times daily. 60 tablet 5   No current facility-administered medications for this visit.     REVIEW OF SYSTEMS:  [X]  denotes positive finding, [ ]  denotes negative finding Cardiac  Comments:  Chest pain or chest pressure:    Shortness of breath upon exertion:    Short of breath when lying flat:    Irregular heart rhythm:    Constitutional    Fever or chills:      PHYSICAL EXAM: Vitals:   06/28/16 1327  BP: (!) 151/105  Pulse: 61  Resp: 16  Temp: 98.7 F (37.1 C)  TempSrc: Oral  SpO2: 93%  Weight: 212 lb (96.2 kg)  Height: 5\' 6"  (1.676 m)    GENERAL: The patient is a well-nourished male, in no acute distress. The vital signs are documented above. CARDIOVASCULAR: There is a regular rate and rhythm. PULMONARY: There is  good air exchange bilaterally without wheezing or rales. He has palpable femoral pulses and palpable pedal pulses bilaterally. His groin sites look fine.  CT ANGIOGRAM ABDOMEN PELVIS: I have reviewed the CT angiogram of the abdomen and pelvis that was done on 06/22/2016. This shows that the stent graft is in good position. 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 There has been no change in the size of the aneurysm. 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 In addition, there is a lesion in the superior pole of the right kidney which measures 2.5 cm maximum diameter. The patient is scheduled for an MRI and an office visit with urology.  MEDICAL ISSUES:  STATUS POST PERCUTANEOUS ENDOVASCULAR ANEURYSM REPAIR: The patient is doing well status post endovascular aneurysm repair. Given the type II endoleak from the IMA and the incidental finding of a small left gastric artery aneurysm I have recommended a follow up CT angiogram in 9 months. In addition the patient has a mass on the right kidney and is scheduled for an MRI and urology visit. I'll see him back in 9 months. He knows to call sooner if he has problems.   [name] [facility] [phone]

## 2016-06-30 NOTE — Addendum Note (Signed)
Addended by: Lianne Cure A on: 06/30/2016 04:55 PM   Modules accepted: Orders

## 2016-07-05 ENCOUNTER — Encounter (INDEPENDENT_AMBULATORY_CARE_PROVIDER_SITE_OTHER): Payer: Self-pay

## 2016-07-05 ENCOUNTER — Telehealth: Payer: Self-pay | Admitting: Family Medicine

## 2016-07-05 ENCOUNTER — Encounter: Payer: Self-pay | Admitting: Physician Assistant

## 2016-07-05 ENCOUNTER — Ambulatory Visit (INDEPENDENT_AMBULATORY_CARE_PROVIDER_SITE_OTHER): Payer: BLUE CROSS/BLUE SHIELD | Admitting: Physician Assistant

## 2016-07-05 VITALS — BP 152/100 | HR 56 | Ht 66.0 in | Wt 213.1 lb

## 2016-07-05 DIAGNOSIS — I10 Essential (primary) hypertension: Secondary | ICD-10-CM | POA: Diagnosis not present

## 2016-07-05 DIAGNOSIS — I5032 Chronic diastolic (congestive) heart failure: Secondary | ICD-10-CM

## 2016-07-05 DIAGNOSIS — Z79899 Other long term (current) drug therapy: Secondary | ICD-10-CM

## 2016-07-05 DIAGNOSIS — I723 Aneurysm of iliac artery: Secondary | ICD-10-CM

## 2016-07-05 DIAGNOSIS — N2889 Other specified disorders of kidney and ureter: Secondary | ICD-10-CM

## 2016-07-05 MED ORDER — LISINOPRIL 40 MG PO TABS: 40.0000 mg | ORAL_TABLET | Freq: Every day | ORAL | 11 refills | Status: DC

## 2016-07-05 NOTE — Patient Instructions (Signed)
Your physician has recommended you make the following change in your medication:  INCREASE  LISINOPRIL TO  40 MG   Your physician recommends that you return for lab work in: [place] recommends that you schedule a follow-up appointment in:  3-4 [place]

## 2016-07-05 NOTE — Telephone Encounter (Signed)
Patient came to the office to drop off a document for this short term disability. Pt needs it as soon as possible. Placing document in PCP's mailbox. Please follow up.  Thank you.

## 2016-07-05 NOTE — Progress Notes (Signed)
Cardiology Office Note    Date:  07/05/2016   ID:  Nicholas Larson, DOB 03-10-1981, MRN [id number]  PCP:  Arnoldo Morale, MD  Cardiologist:  New  Chief Complaint: Chronic diastolic CHF  History of Present Illness:   Nicholas Larson is a 36 y.o. male HTN and chronic diastolic CHF when sent by PCP for evaluation of diastolic CHF.    Admitted 05/25/16-05/28/16 for acute L groin pain and elevated BP. 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 s/p aneurysm repair and embolization by vascular surgery on 05/26/16. CT of the abdomen and pelvis also revealed 2.7 cm upper pole enhancing right renal mass suggestive of malignancy. Echo revealed ejection fraction of 123456, grade 2 diastolic dysfunction, no regional wall motion abnormalities.   Patient states that he has long standing hx of CHF and HTN since 2009. He was on total 5 maximum dose of antihypertensive. He was not on any antihypertensive regimen prior to admission as his BP has been normalized. Now his BP runs in 150/90-100s at home. The patient denies nausea, vomiting, fever, chest pain, palpitations, shortness of breath, orthopnea, PND, dizziness, syncope, cough, congestion, abdominal pain, hematochezia, melena, lower extremity edema.  Hx to 10 back year tobacco smoking.  Quit 01/2016. Family hx of brain aneurysm.   Past Medical History:  Diagnosis Date  . AAA (abdominal aortic aneurysm) ([facility])    s/p Repair 05/26/16  . 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 CHF (congestive heart failure) ([facility])   . Hypertension   . Right renal mass     Past Surgical History:  Procedure Laterality Date  . ABDOMINAL AORTIC ENDOVASCULAR STENT GRAFT N/A 05/26/2016   Procedure: ABDOMINAL AORTIC ENDOVASCULAR STENT GRAFT;  Surgeon: Angelia Mould, MD;  Location: [facility];  Service: Vascular;  Laterality: N/A;  . EMBOLIZATION Left 05/26/2016   Procedure: COIL EMBOLIZATION LEFT HYPOGASTRIC ARTERY;  Surgeon: Angelia Mould, MD;  Location: [facility];  Service: Vascular;  Laterality: Left;    Current Medications: Prior to Admission medications   Medication Sig Start Date End Date Taking? Authorizing Provider  lisinopril (PRINIVIL,ZESTRIL) 20 MG tablet Take 1 tablet (20 mg total) by mouth daily. 06/09/16   Arnoldo Morale, MD  metoprolol (LOPRESSOR) 50 MG tablet Take 1 tablet (50 mg total) by mouth 2 (two) times daily. 06/09/16   Arnoldo Morale, MD    Allergies:   Patient has no known allergies.   Social History   Social History  . Marital status: Divorced    Spouse name: N/A  . Number of children: N/A  . Years of education: N/A   Social History Main Topics  . Smoking status: Former Smoker    Packs/day: 1.00    Types: Cigarettes    Quit date: 01/2016  . Smokeless tobacco: Never Used  . Alcohol use No     Comment: last drink 01/2016  . Drug use: No  . Sexual activity: Not on file   Other Topics Concern  . Not on file   Social History Narrative  . No narrative on file     Family History:  The patient's family history includes Aneurysm in his father; Hypertension in his mother.   ROS:   Please see the history of present illness.    ROS All other systems reviewed and are negative.   PHYSICAL EXAM:   VS:  BP (!) 152/100  Pulse (!) 56   Ht 5\' 6"  (1.676 m)   Wt 213 lb 1.9 oz (96.7 kg)   BMI 34.40 kg/m    GEN: Well nourished, well developed, in no acute distress  HEENT: normal  Neck: no JVD, carotid bruits, or masses Cardiac:RRR; no murmurs, rubs, or gallops,no edema  Respiratory:  clear to auscultation bilaterally, normal work of breathing GI: soft, nontender, nondistended, + BS MS: no deformity or atrophy  Skin: warm and dry, no rash Neuro:  Alert and Oriented x 3, Strength and sensation are intact Psych: euthymic mood, full affect  Wt Readings from Last 3 Encounters:  07/05/16 213 lb 1.9 oz (96.7 kg)    06/28/16 212 lb (96.2 kg)  06/09/16 203 lb 3.2 oz (92.2 kg)      Studies/Labs Reviewed:   EKG:  EKG is ordered today.  The ekg ordered today demonstrates sinus bradycardia at rate of 56 bpm.   Recent Labs: 05/25/2016: ALT 20 05/27/2016: BUN 11; Creatinine, Ser 0.97; Hemoglobin 16.4; Magnesium 1.9; Platelets 175; Potassium 3.9; Sodium 138   Lipid Panel    Component Value Date/Time   CHOL 161 01/22/2015 1212   TRIG 125 01/22/2015 1212   HDL 39 (L) 01/22/2015 1212   CHOLHDL 4.1 01/22/2015 1212   VLDL 25 01/22/2015 1212   LDLCALC 97 01/22/2015 1212    Additional studies/ records that were reviewed today include:   Echocardiogram: 05/15/16 LV EF: 60% -   65%  ------------------------------------------------------------------- Indications:      (I50.9).  ------------------------------------------------------------------- History:   PMH:  Acquired from the patient and from the patient&'s chart.  Congestive heart failure.  Risk factors:  Current tobacco use. Hypertension.  ------------------------------------------------------------------- Study Conclusions  - Left ventricle: The cavity size was normal. There was moderate   concentric hypertrophy. Systolic function was normal. The   estimated ejection fraction was in the range of 60% to 65%. Wall   motion was normal; there were no regional wall motion   abnormalities. Doppler parameters are consistent with   pseudonormal left ventricular relaxation (grade 2 diastolic   dysfunction). The E/e&' ratio is >15, suggesting elevated LV   filling pressure. - Mitral valve: Mildly thickened leaflets . There was trivial   regurgitation. - Left atrium: The atrium was mildly dilated. - Atrial septum: Atrial septal aneurysm. Cannot exclude a small PFO   by color doppler. - Tricuspid valve: There was trivial regurgitation. - Pulmonary arteries: PA peak pressure: 25 mm Hg (S). - Inferior vena cava: The vessel was normal in size.  The   respirophasic diameter changes were in the normal range (>= 50%),   consistent with normal central venous pressure.  Impressions:  - LVEF 60-65%, moderate LVH, normal wall motion, Grade 2 DD with   elevated LV filling pressure, trivial MR, mild LAE, atrial septal   aneursym - cannot exclude a small PFO, normal RVSP, normal IVC.    PROCEDURE:   05/26/16 1. Percutaneous Endovascular aneurysm repair using 3 components 2. 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 Chronic diastolic CHF - Patient is euvolemic. Discuss signs of heart failure detail. He is not eating excess salt since discharge.  2. HTN - Elevated today in clinic and at home as well. Will increase lisinopril to 40 mg once a day. Unable to up titrate beta blocker due to baseline bradycardia.  3. 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 Scot Dock  4.  Right kidney mass - Pending MRI. Seen by Dr. Junious Silk. Plan for removal of mass/biopsy in your future.  Plan discussed with DOD Dr. Rayann Heman. The patient will be followed by Dr. Saunders Revel.    Medication Adjustments/Labs and Tests Ordered: Current medicines are reviewed at length with the patient today.  Concerns regarding medicines are outlined above.  Medication changes, Labs and Tests ordered today are listed in the Patient Instructions below. Patient Instructions  Your physician has recommended you make the following change in your medication:  INCREASE  LISINOPRIL TO  40 MG   Your physician recommends that you return for lab work in: [place] recommends that you schedule a follow-up appointment in:  3-4 WEEKS WITH  DR  END     Jarrett Soho, [place]  07/05/2016 10:22 AM    [address], [place], [place]  [id number] Phone: [phone]; Fax: [phone]

## 2016-07-07 NOTE — Telephone Encounter (Signed)
Noted  

## 2016-07-10 ENCOUNTER — Encounter: Payer: Self-pay | Admitting: Family Medicine

## 2016-07-10 ENCOUNTER — Ambulatory Visit: Payer: BLUE CROSS/BLUE SHIELD | Attending: Family Medicine | Admitting: Family Medicine

## 2016-07-10 VITALS — BP 147/90 | HR 64 | Temp 98.7°F | Ht 66.0 in | Wt 218.0 lb

## 2016-07-10 DIAGNOSIS — N2889 Other specified disorders of kidney and ureter: Secondary | ICD-10-CM | POA: Diagnosis not present

## 2016-07-10 DIAGNOSIS — I729 Aneurysm of unspecified site: Secondary | ICD-10-CM | POA: Diagnosis present

## 2016-07-10 DIAGNOSIS — I10 Essential (primary) hypertension: Secondary | ICD-10-CM | POA: Diagnosis not present

## 2016-07-10 DIAGNOSIS — Z9889 Other specified postprocedural states: Secondary | ICD-10-CM | POA: Insufficient documentation

## 2016-07-10 DIAGNOSIS — I671 Cerebral aneurysm, nonruptured: Secondary | ICD-10-CM | POA: Diagnosis not present

## 2016-07-10 DIAGNOSIS — Z79899 Other long term (current) drug therapy: Secondary | ICD-10-CM | POA: Insufficient documentation

## 2016-07-10 DIAGNOSIS — I5032 Chronic diastolic (congestive) heart failure: Secondary | ICD-10-CM | POA: Insufficient documentation

## 2016-07-10 DIAGNOSIS — I110 Hypertensive heart disease with heart failure: Secondary | ICD-10-CM | POA: Insufficient documentation

## 2016-07-10 NOTE — Progress Notes (Signed)
Subjective:    Patient ID: Nicholas Larson, male    DOB: 1980/09/25, 36 y.o.   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 He underwent aneurysm repair and embolization by vascular surgery on 05/26/16. CT of the abdomen and pelvis also revealed 2.7 cm upper pole enhancing right renal mass suggestive of malignancy.  Since his last visit he has been to see vascular and vein specialists of [facility] and also urology - Dr. Junious Silk with plans for biopsy/resection of renal mass and he has an upcoming appointment for an MRI to this effect. At his visit with cardiology his lisinopril was increased to 40 mg. He also had a screening MRI/MRA of his brain due to positive family history of aneurysms which was negative for brain aneurysms.  Reports doing well and denies abdominal pain, nausea or vomiting he has no chest pains or shortness of breath or pedal edema.   Past Medical History:  Diagnosis Date  . AAA (abdominal aortic aneurysm) ([facility])    s/p Repair 05/26/16  . 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 CHF (congestive heart failure) ([place] [facility])   . Hypertension   . Right renal mass     Past Surgical History:  Procedure Laterality Date  . ABDOMINAL AORTIC ENDOVASCULAR STENT GRAFT N/A 05/26/2016   Procedure: ABDOMINAL AORTIC ENDOVASCULAR STENT GRAFT;  Surgeon: Angelia Mould, MD;  Location: [facility];  Service: Vascular;  Laterality: N/A;  . EMBOLIZATION Left 05/26/2016   Procedure: COIL EMBOLIZATION LEFT HYPOGASTRIC ARTERY;  Surgeon: Angelia Mould, MD;  Location: [facility];  Service: Vascular;   Laterality: Left;    No Known Allergies  Current Outpatient Prescriptions on File Prior to Visit  Medication Sig Dispense Refill  . lisinopril (PRINIVIL,ZESTRIL) 40 MG tablet Take 1 tablet (40 mg total) by mouth daily. 30 tablet 11  . metoprolol (LOPRESSOR) 50 MG tablet Take 1 tablet (50 mg total) by mouth 2 (two) times daily. 60 tablet 5   No current facility-administered medications on file prior to visit.      Review of Systems Constitutional: Negative for activity change and appetite change.  HENT: Negative for sinus pressure and sore throat.   Eyes: Negative for visual disturbance.  Respiratory: Negative for cough, chest tightness and shortness of breath.   Cardiovascular: Negative for chest pain and leg swelling.  Gastrointestinal: Negative for abdominal distention, abdominal pain, constipation and diarrhea.  Endocrine: Negative.   Genitourinary: Negative for dysuria.  Musculoskeletal: Negative for joint swelling and myalgias.  Skin: Negative for rash.  Allergic/Immunologic: Negative.   Neurological: Negative for weakness, light-headedness and numbness.  Psychiatric/Behavioral: Negative for dysphoric mood and suicidal ideas.     Objective: Vitals:   07/10/16 1455  BP: (!) 147/90  Pulse: 64  Temp: 98.7 F (37.1 C)  TempSrc: Oral  SpO2: 94%  Weight: 218 lb (98.9 kg)  Height: 5\' 6"  (1.676 m)      Physical Exam Constitutional: He is oriented to person, place, and time. He appears well-developed and well-nourished.  Neck: No JVD present.  Cardiovascular: Normal rate, normal heart sounds and intact distal  pulses.   No murmur heard. Pulmonary/Chest: Effort normal and breath sounds normal. He has no wheezes. He has no rales. He exhibits no tenderness.  Abdominal: Soft. Bowel sounds are normal. He exhibits no distension and no mass. There is no tenderness.  Musculoskeletal: Normal range of motion.  Neurological: He is alert and oriented to person, place, and time.    Skin: Skin is warm and dry.  Psychiatric: He has a normal mood and affect.          Assessment & Plan:  1. Saccular aneurysm Status post endovascular aneurysm repair. Scheduled for CT angiogram in 9 months to follow-up on small left gastric artery aneurysm Keep appointment with pain and vascular specialist .  2. Right renal mass -Seen by urology - Dr Junious Silk Scheduled for MRI Plan for removal of mass/biopsy.  3. Chronic diastolic congestive heart failure (HCC) EF 123456, grade 2 diastolic dysfunction from 2-D echo 04/2016 Discussed heart healthy diet, limiting fluid intake to less than 2 L per day, daily weight checks  4. Essential hypertension Slight systolic elevation Continue metoprolol and increased dose of lisinopril Low-sodium diet

## 2016-07-12 ENCOUNTER — Other Ambulatory Visit: Payer: BLUE CROSS/BLUE SHIELD | Admitting: *Deleted

## 2016-07-12 ENCOUNTER — Telehealth: Payer: Self-pay | Admitting: Family Medicine

## 2016-07-12 DIAGNOSIS — Z79899 Other long term (current) drug therapy: Secondary | ICD-10-CM

## 2016-07-12 LAB — BASIC METABOLIC PANEL
BUN / CREAT RATIO: 10 (ref 9–20)
BUN: 11 mg/dL (ref 6–20)
CALCIUM: 9.1 mg/dL (ref 8.7–10.2)
CHLORIDE: 100 mmol/L (ref 96–106)
CO2: 25 mmol/L (ref 18–29)
Creatinine, Ser: 1.06 mg/dL (ref 0.76–1.27)
GFR calc non Af Amer: 90 mL/min/{1.73_m2} (ref >59)
GFR, EST AFRICAN AMERICAN: 104 mL/min/{1.73_m2} (ref >59)
GLUCOSE: 119 mg/dL — AB (ref 65–99)
POTASSIUM: 3.8 mmol/L (ref 3.5–5.2)
Sodium: 143 mmol/L (ref 134–144)

## 2016-07-12 NOTE — Telephone Encounter (Signed)
Pt. Came to facility requesting a letter form his PCP stating that he is cleared to return to work.  Pt. Was seen by his PCP on 07/10/16. Please f/u

## 2016-07-14 ENCOUNTER — Ambulatory Visit (HOSPITAL_COMMUNITY)
Admission: RE | Admit: 2016-07-14 | Discharge: 2016-07-14 | Disposition: A | Discharge status: Home / Self Care | Payer: BLUE CROSS/BLUE SHIELD | Source: Ambulatory Visit | Attending: Urology | Admitting: Urology

## 2016-07-14 DIAGNOSIS — D4101 Neoplasm of uncertain behavior of right kidney: Secondary | ICD-10-CM | POA: Insufficient documentation

## 2016-07-14 DIAGNOSIS — N289 Disorder of kidney and ureter, unspecified: Secondary | ICD-10-CM | POA: Insufficient documentation

## 2016-07-14 MED ORDER — GADOBENATE DIMEGLUMINE 529 MG/ML IV SOLN
20.0000 mL | Freq: Once | INTRAVENOUS | Status: AC | PRN
  Administered 2016-07-14: 20 mL via INTRAVENOUS

## 2016-07-17 ENCOUNTER — Encounter: Payer: Self-pay | Admitting: Internal Medicine

## 2016-07-27 ENCOUNTER — Ambulatory Visit (INDEPENDENT_AMBULATORY_CARE_PROVIDER_SITE_OTHER): Payer: BLUE CROSS/BLUE SHIELD | Admitting: Internal Medicine

## 2016-07-27 ENCOUNTER — Encounter: Payer: Self-pay | Admitting: Internal Medicine

## 2016-07-27 VITALS — BP 182/98 | HR 63 | Ht 65.0 in | Wt 220.4 lb

## 2016-07-27 DIAGNOSIS — I10 Essential (primary) hypertension: Secondary | ICD-10-CM

## 2016-07-27 DIAGNOSIS — I723 Aneurysm of iliac artery: Secondary | ICD-10-CM

## 2016-07-27 DIAGNOSIS — I714 Abdominal aortic aneurysm, without rupture, unspecified: Secondary | ICD-10-CM

## 2016-07-27 DIAGNOSIS — N2889 Other specified disorders of kidney and ureter: Secondary | ICD-10-CM | POA: Diagnosis not present

## 2016-07-27 MED ORDER — AMLODIPINE BESYLATE 5 MG PO TABS: 5.0000 mg | ORAL_TABLET | Freq: Every day | ORAL | 3 refills | Status: DC

## 2016-07-27 MED ORDER — CLONIDINE HCL 0.1 MG PO TABS: 0.1000 mg | ORAL_TABLET | Freq: Once | ORAL | Status: AC

## 2016-07-27 NOTE — Progress Notes (Signed)
Follow-up Outpatient Visit Date: 07/27/2016  Primary Care Provider: Arnoldo Morale, MD [place] [address]  Chief Complaint: Follow-up hypertension  HPI:  Nicholas Larson is a 36 y.o. year-old male with history of hypertension, heart failure with preserved ejection fraction (HFpEF), AAA and left iliac artery aneurysm s/p endovascular repair and coiling of the left hypogastric artery, recently discovered right renal mass (suspicious for renal cell carcinoma), who presents for follow-up of hypertension and HFpEF. The patient was last seen in our office by Nicholas Larson on 07/05/16, at which time lisinopril was increased to 40 mg daily due to suboptimal blood pressure control. Nicholas Larson has been tolerating this well. Follow-up BMP after dose escalation showed normal kidney function and potassium. The patient reports feeling well, denying chest pain, shortness of breath, edema, orthopnea, PND, palpitations, lightheadedness, and headaches. He is compliant with his medications. He has upcoming urology follow-up to discuss surgery for right renal mass.  In regard to his hypertension, Nicholas Larson states that he was diagnosed n 2009. His blood pressure had been hard to control, requiring up to 5 separate agents. He had otherwise been healthy as a child and young adult. He notes several family members, including his father, who had brain aneurysms.  --------------------------------------------------------------------------------------------------  Cardiovascular History & Procedures: Cardiovascular Problems:  Hypertension  Abdominal aortic and left iliac artery aneurysms  Risk Factors:  None  Cath/PCI:  None  CV Surgery:  Endovascular AAA and left common iliac aneurysm repair and left hypogastric artery coil embolization (05/26/16, Dr. Scot Dock)  EP Procedures and Devices:  None  Non-Invasive Evaluation(s):  TTE (05/15/16): Normal LV size with moderate concentric LVH (EF  60-56%) with grade 2 diastolic dysfunction and elevated LV filling pressure. Mild mitral valve thickening with trivial MR. Mild left atrial enlargement. Normal RV size and function.  Recent CV Pertinent Labs: Lab Results  Component Value Date   CHOL 161 01/22/2015   HDL 39 (L) 01/22/2015   LDLCALC 97 01/22/2015   TRIG 125 01/22/2015   CHOLHDL 4.1 01/22/2015   INR 0.93 05/26/2016   K 3.8 07/12/2016   MG 1.9 05/27/2016   BUN 11 07/12/2016   CREATININE 1.06 07/12/2016   CREATININE 0.95 01/22/2015    Past medical and surgical history were reviewed and updated in EPIC.  Outpatient Encounter Prescriptions as of 07/27/2016  Medication Sig  . lisinopril (PRINIVIL,ZESTRIL) 40 MG tablet Take 1 tablet (40 mg total) by mouth daily.  . metoprolol (LOPRESSOR) 50 MG tablet Take 1 tablet (50 mg total) by mouth 2 (two) times daily.   No facility-administered encounter medications on file as of 07/27/2016.     Allergies: Patient has no known allergies.  Social History   Social History  . Marital status: Divorced    Spouse name: N/A  . Number of children: N/A  . Years of education: N/A   Occupational History  . Not on file.   Social History Main Topics  . Smoking status: Former Smoker    Packs/day: 1.00    Types: Cigarettes    Quit date: 01/2016  . Smokeless tobacco: Never Used  . Alcohol use No     Comment: last drink 01/2016  . Drug use: No  . Sexual activity: Not on file   Other Topics Concern  . Not on file   Social History Narrative  . No narrative on file    Family History  Problem Relation Age of Onset  . Hypertension Mother   . Aneurysm Father  head    Review of Systems: A 12-system review of systems was performed and was negative except as noted in the HPI.  --------------------------------------------------------------------------------------------------  Physical Exam: BP (!) 180/110   Pulse 63   Ht 5\' 5"  (1.651 m)   Wt 220 lb 6.4 oz (100 kg)   BMI  36.68 kg/m  Recheck: 182/98  General:  Obese man, seated comfortably in bed HEENT: No conjunctival pallor or scleral icterus.  Moist mucous membranes.  OP clear. Neck: Supple without lymphadenopathy, thyromegaly, JVD, or HJR. Lungs: Normal work of breathing.  Clear to auscultation bilaterally without wheezes or crackles. Heart: Regular rate and rhythm without murmurs, rubs, or gallops.  Non-displaced PMI. Abd: Bowel sounds present.  Soft, NT/ND. Unable to assess HSM due to body habitus. Ext: No lower extremity edema.  Radial, PT, and DP pulses are 2+ bilaterally. Skin: warm and dry without rash  Lab Results  Component Value Date   WBC 12.8 (H) 05/27/2016   HGB 16.4 05/27/2016   HCT 46.5 05/27/2016   MCV 78.4 05/27/2016   PLT 175 05/27/2016    Lab Results  Component Value Date   NA 143 07/12/2016   K 3.8 07/12/2016   CL 100 07/12/2016   CO2 25 07/12/2016   BUN 11 07/12/2016   CREATININE 1.06 07/12/2016   GLUCOSE 119 (H) 07/12/2016   ALT 20 05/25/2016    Lab Results  Component Value Date   CHOL 161 01/22/2015   HDL 39 (L) 01/22/2015   LDLCALC 97 01/22/2015   TRIG 125 01/22/2015   CHOLHDL 4.1 01/22/2015    --------------------------------------------------------------------------------------------------  ASSESSMENT AND PLAN: Uncontrolled hypertension Blood pressure is quite elevated again today, though the patient is asymptomatic. He was given clonidine 0.1 mg x 1 with improvement in his blood pressure to 159/78. I think it is safe to continue with outpatient management of his blood pressure. We have agreed to start amlodipine 5 mg daily and continue with lisinopril 40 mg daily and metoprolol tartrate 50 mg BID. We will have Nicholas Larson return for an APP visit in 2-4 weeks to reevaluate his blood pressure and make additional medication changes, as needed. I also advised him to monitor his blood pressure at home. If blood pressure control remains difficult, evaluation for  secondary causes (particularly hyperaldosteronism and pheochromocytoma) will need to be considered. I have personally reviewed his most recent abdominal CTA images and do not see any evidence of significant renal artery stenosis.  HFpEF Patient appears euovolemic and well-compensated. We will attempt to improve blood pressure control. Low-sodium diet reinforced.  AAA and left iliac artery aneurysm No recurrent pain status post endovascular treatment. Continue blood pressure control; follow-up with Dr. Scot Dock as previously arranged.  Right renal mass Recent MRI concerning for RCC, with urology follow-up pending. Given patient's young age, RCC, and aneurysms involving the abdominal aorta and left iliac artery, an underlying syndrome should be entertained. Genetic referral may be helpful. I will defer this to Dr. Jarold Song.  Follow-up: Return to clinic in 2-4 weeks with blood pressure evaluation with APP.  Nelva Bush, MD 07/27/2016 10:12 AM

## 2016-07-27 NOTE — Patient Instructions (Signed)
**Note De-Identified [place] Obfuscation** Medication Instructions:  Start taking Amlodipine 5 mg daily. All other medications remain the same  Labwork: None  Testing/Procedures: None  Follow-Up: In 2 to 4 weeks with APP     If you need a refill on your cardiac medications before your next appointment, please call your pharmacy.

## 2016-08-02 ENCOUNTER — Telehealth: Payer: Self-pay

## 2016-08-02 NOTE — Telephone Encounter (Signed)
Patient was called today by writer regarding the short term disability form that he gave to MD at last visit.  Dr. Jarold Song was told by patient that he brought the wrong form and he would be dropping a different one off.  Writer LVM asking patient to call back with some direction regarding the forms.

## 2016-08-03 ENCOUNTER — Telehealth: Payer: Self-pay

## 2016-08-03 NOTE — Telephone Encounter (Signed)
Writer called patient and LVM for patient questioning when he was going to drop the new form off for MD to fill out. Requested patient to return call.

## 2016-08-03 NOTE — Telephone Encounter (Signed)
error 

## 2016-08-10 ENCOUNTER — Other Ambulatory Visit: Payer: Self-pay | Admitting: Urology

## 2016-08-10 NOTE — Progress Notes (Signed)
Cardiology Office Note    Date:  08/15/2016   ID:  Nicholas Larson, DOB [birth year], MRN [id number]  PCP:  Nicholas Morale, MD  Cardiologist:  Dr. Saunders Revel  Chief Complaint: Hypertension follow up  History of Present Illness:   Nicholas Larson is a 36 y.o. 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   Seen by me 07/07/16 to establish care for HTN. Increased lisinopril to 40mg  qd.  Seen by Dr. Saunders Revel 07/27/16. BP was elevated. He was given clonidine 0.1 mg x 1 with improvement in his blood pressure to 159/78. Added amlodipine 5 mg daily and continued with lisinopril 40 mg daily and metoprolol tartrate 50 mg BID. Dr. Saunders Revel reviewed CTA images and do not see any evidence of significant renal artery stenosis. If remained hypertensive --> evaluation for secondary causes (particularly hyperaldosteronism and pheochromocytoma).   Here for follow up. Bp of 150/100s at home. The patient denies nausea, vomiting, fever, chest pain, palpitations, shortness of breath, orthopnea, PND, dizziness, syncope, cough, congestion, abdominal pain, hematochezia, melena, lower extremity edema. Compliant with medication and low sodium diet.    Past Medical History:  Diagnosis Date  . AAA (abdominal aortic aneurysm) ([place])    s/p Repair 05/26/16  . 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 CHF (congestive heart failure) ([place])   . Hypertension   . Right renal mass     Past Surgical History:  Procedure Laterality Date  . ABDOMINAL AORTIC ENDOVASCULAR STENT GRAFT N/A 05/26/2016   Procedure: ABDOMINAL AORTIC ENDOVASCULAR STENT GRAFT;  Surgeon: Angelia Mould, MD;  Location: [facility];  Service: Vascular;  Laterality: N/A;  . EMBOLIZATION Left 05/26/2016   Procedure: COIL EMBOLIZATION LEFT  HYPOGASTRIC ARTERY;  Surgeon: Angelia Mould, MD;  Location: [facility];  Service: Vascular;  Laterality: Left;    Current Medications: Prior to Admission medications   Medication Sig Start Date End Date Taking? Authorizing Provider  amLODipine (NORVASC) 5 MG tablet Take 1 tablet (5 mg total) by mouth daily. 07/27/16 10/25/16  Nelva Bush, MD  lisinopril (PRINIVIL,ZESTRIL) 40 MG tablet Take 1 tablet (40 mg total) by mouth daily. 07/05/16   Janyah Singleterry, PA  metoprolol (LOPRESSOR) 50 MG tablet Take 1 tablet (50 mg total) by mouth 2 (two) times daily. 06/09/16   Nicholas Morale, MD    Allergies:   Patient has no known allergies.   Social History   Social History  . Marital status: Divorced    Spouse name: N/A  . Number of children: N/A  . Years of education: N/A   Social History Main Topics  . Smoking status: Former Smoker    Packs/day: 1.00    Types: Cigarettes    Quit date: 01/2016  . Smokeless tobacco: Never Used  . Alcohol use No     Comment: last drink 01/2016  . Drug use: No  . Sexual activity: Not Asked   Other Topics Concern  . None   Social History Narrative  . None     Family History:  The patient's family history includes Aneurysm in his father and paternal aunt; Cancer in his maternal uncle; Hypertension in his mother.   ROS:   Please see the history of present illness.    ROS All other systems reviewed and are negative.  PHYSICAL EXAM:   VS:  BP (!) 172/120   Pulse 74   Ht 5\' 4"  (1.626 m)   Wt 224 lb 6.4 oz (101.8 kg)   SpO2 98%   BMI 38.52 kg/m    GEN: Well nourished, well developed, in no acute distress  HEENT: normal  Neck: no JVD, carotid bruits, or masses Cardiac: RRR; no murmurs, rubs, or gallops,no edema  Respiratory:  clear to auscultation bilaterally, normal work of breathing GI: soft, nontender, nondistended, + BS MS: no deformity or atrophy  Skin: warm and dry, no rash Neuro:  Alert and Oriented x 3, Strength and sensation are  intact Psych: euthymic mood, full affect  Wt Readings from Last 3 Encounters:  08/15/16 224 lb 6.4 oz (101.8 kg)  07/27/16 220 lb 6.4 oz (100 kg)  07/10/16 218 lb (98.9 kg)      Studies/Labs Reviewed:   EKG:  EKG is not ordered today.   Recent Labs: 05/25/2016: ALT 20 05/27/2016: Hemoglobin 16.4; Magnesium 1.9; Platelets 175 07/12/2016: BUN 11; Creatinine, Ser 1.06; Potassium 3.8; Sodium 143   Lipid Panel    Component Value Date/Time   CHOL 161 01/22/2015 1212   TRIG 125 01/22/2015 1212   HDL 39 (L) 01/22/2015 1212   CHOLHDL 4.1 01/22/2015 1212   VLDL 25 01/22/2015 1212   LDLCALC 97 01/22/2015 1212    Additional studies/ records that were reviewed today include:   Echocardiogram: 05/15/16 LV EF: 60% - 65%  ------------------------------------------------------------------- Indications: (I50.9).  ------------------------------------------------------------------- History: PMH: Acquired from the patient and from the patient&'s chart. Congestive heart failure. Risk factors: Current tobacco use. Hypertension.  ------------------------------------------------------------------- Study Conclusions  - Left ventricle: The cavity size was normal. There was moderate concentric hypertrophy. Systolic function was normal. The estimated ejection fraction was in the range of 60% to 65%. Wall motion was normal; there were no regional wall motion abnormalities. Doppler parameters are consistent with pseudonormal left ventricular relaxation (grade 2 diastolic dysfunction). The E/e&' ratio is >15, suggesting elevated LV filling pressure. - Mitral valve: Mildly thickened leaflets . There was trivial regurgitation. - Left atrium: The atrium was mildly dilated. - Atrial septum: Atrial septal aneurysm. Cannot exclude a small PFO by color doppler. - Tricuspid valve: There was trivial regurgitation. - Pulmonary arteries: PA peak pressure: 25 mm Hg  (S). - Inferior vena cava: The vessel was normal in size. The respirophasic diameter changes were in the normal range (>= 50%), consistent with normal central venous pressure.  Impressions:  - LVEF 60-65%, moderate LVH, normal wall motion, Grade 2 DD with elevated LV filling pressure, trivial MR, mild LAE, atrial septal aneursym - cannot exclude a small PFO, normal RVSP, normal IVC.    PROCEDURE:  05/26/16 1. Percutaneous Endovascular aneurysm repair using 3 components 2. 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 Uncontrolled Hypertension - Initial BP of 172/120. Repeat check 150/96 after 30 minutes. Asymptomatic. Will increase amlodipine to 10mg  qd. Follow up with hypertension clinic in 2 week. Keep long and bring during visit. Will defer evaluation of hyperaldosteronism and pheochromocytoma to Dr. Saunders Revel.   2. Chronic diastolic CHF Euvolemic.   3. AAA and left iliac artery aneurysm - Followed by Dr. Scot Dock      Medication Adjustments/Labs and Tests Ordered: Current medicines are reviewed at length with the patient today.  Concerns regarding medicines are outlined above.  Medication changes, Labs and  Tests ordered today are listed in the Patient Instructions below. Patient Instructions  Medication Instructions:  INCREASE Amlodipine to 10mg  daily. An Rx has been sent to your pharmacy  Labwork: None ordered  Testing/Procedures: None orderd  Follow-Up: Your physician recommends that you schedule a follow-up appointment in: 2 weeks with the Pharmacist in the Hypertension Clininc  Any Other Special Instructions Will Be Listed Below (If Applicable).     If you need a refill on your cardiac medications before your next appointment, please call your pharmacy.      Jarrett Soho, [place]  08/15/2016 3:40 PM    [facility] [place], [place], [place]  [address] Phone: [phone]; Fax: [phone]

## 2016-08-15 ENCOUNTER — Encounter: Payer: Self-pay | Admitting: Physician Assistant

## 2016-08-15 ENCOUNTER — Ambulatory Visit (INDEPENDENT_AMBULATORY_CARE_PROVIDER_SITE_OTHER): Payer: BLUE CROSS/BLUE SHIELD | Admitting: Physician Assistant

## 2016-08-15 VITALS — BP 172/120 | HR 74 | Ht 64.0 in | Wt 224.4 lb

## 2016-08-15 DIAGNOSIS — I5032 Chronic diastolic (congestive) heart failure: Secondary | ICD-10-CM | POA: Diagnosis not present

## 2016-08-15 DIAGNOSIS — I714 Abdominal aortic aneurysm, without rupture, unspecified: Secondary | ICD-10-CM

## 2016-08-15 DIAGNOSIS — I10 Essential (primary) hypertension: Secondary | ICD-10-CM

## 2016-08-15 MED ORDER — AMLODIPINE BESYLATE 10 MG PO TABS: 10.0000 mg | ORAL_TABLET | Freq: Every day | ORAL | 5 refills | Status: DC

## 2016-08-15 NOTE — Patient Instructions (Signed)
Medication Instructions:  INCREASE Amlodipine to 10mg  daily. An Rx has been sent to your pharmacy  Labwork: None ordered  Testing/Procedures: None orderd  Follow-Up: Your physician recommends that you schedule a follow-up appointment in: 2 weeks with the Pharmacist in the Hypertension Clin[facility]  Any Other Special Instructions Will Be Listed Below (If Applicable).     If you need a refill on your cardiac medications before your next appointment, please call your pharmacy.

## 2016-08-17 MED ORDER — MAGNESIUM CITRATE PO SOLN
1.0000 | Freq: Once | ORAL | Status: AC
Start: 2016-08-17 — End: ?

## 2016-08-29 ENCOUNTER — Ambulatory Visit (INDEPENDENT_AMBULATORY_CARE_PROVIDER_SITE_OTHER): Payer: BLUE CROSS/BLUE SHIELD | Admitting: Pharmacist

## 2016-08-29 VITALS — BP 152/104 | HR 74

## 2016-08-29 DIAGNOSIS — I10 Essential (primary) hypertension: Secondary | ICD-10-CM

## 2016-08-29 MED ORDER — CARVEDILOL 12.5 MG PO TABS: 12.5000 mg | ORAL_TABLET | Freq: Two times a day (BID) | ORAL | 11 refills | Status: DC

## 2016-08-29 MED ORDER — CHLORTHALIDONE 25 MG PO TABS: 25.0000 mg | ORAL_TABLET | Freq: Every day | ORAL | 11 refills | Status: DC

## 2016-08-29 NOTE — Progress Notes (Signed)
Patient ID: Nicholas Larson                 DOB: 11-15-1980                      MRN: [id number]     HPI: Nicholas Larson is a 36 y.o. male patient of Dr End referred by Nicholas Lis, PA to HTN clinic. PMH is significant for diastolic CHF, HTN, AAA and left iliac artery aneurysm, obesity, and recently discovered right renal mass suspicious for renal cell carcinoma. Pt reports diagnosis of HTN in 2009, sometimes requiring up to 5 BP medications to control. No evidence of significant renal artery stenosis. He was given clonidine in the office 1 month ago when BP was elevated to 182/98.  At last visit 2 weeks ago, BP was elevated to 150/96 and amlodipine was increased to 10mg  daily.   Pt reports feeling well overall. Denies dizziness, blurred vision, headache, or falls. He is in the process of moving and has been a bit more stressed lately. He has also not checked his BP at home since increasing his amlodipine dose 2 weeks ago. Denies any lower extremity swelling.   Current HTN meds: amlodipine 10mg  daily, lisinopril 40mg  daily, metoprolol 50mg  BID BP goal: <130/29mmHg  Family History: The patient's family history includes Aneurysm in his father and paternal aunt; Cancer in his maternal uncle; Hypertension in his mother.   Social History: Formerly smoked 1 PPD and drank alcohol, quit both in Sept 2017. Denies illicit drug use.  Diet: Snacks throughout the day - works 11 hour shifts. Likes chips and fruit but has been trying to cut back on chips. Drinks 1 soda each day, minimal other caffeine intake.  Exercise: Walks frequently at work.   Wt Readings from Last 3 Encounters:  08/15/16 224 lb 6.4 oz (101.8 kg)  07/27/16 220 lb 6.4 oz (100 kg)  07/10/16 218 lb (98.9 kg)   BP Readings from Last 3 Encounters:  08/15/16 (!) 172/120  07/27/16 (!) 182/98  07/10/16 (!) 147/90   Pulse Readings from Last 3 Encounters:  08/15/16 74  07/27/16 63  07/10/16 64    Renal function: CrCl cannot be calculated  (Patient's most recent lab result is older than the maximum 21 days allowed.).  Past Medical History:  Diagnosis Date  . AAA (abdominal aortic aneurysm) ([place])    s/p Repair 05/26/16  . 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 CHF (congestive heart failure) ([place])   . Hypertension   . Right renal mass     Current Outpatient Prescriptions on File Prior to Visit  Medication Sig Dispense Refill  . amLODipine (NORVASC) 10 MG tablet Take 1 tablet (10 mg total) by mouth daily. 30 tablet 5  . lisinopril (PRINIVIL,ZESTRIL) 40 MG tablet Take 1 tablet (40 mg total) by mouth daily. 30 tablet 11  . metoprolol (LOPRESSOR) 50 MG tablet Take 1 tablet (50 mg total) by mouth 2 (two) times daily. 60 tablet 5   Current Facility-Administered Medications on File Prior to Visit  Medication Dose Route Frequency Provider Last Rate Last Dose  . cloNIDine (CATAPRES) tablet 0.1 mg  0.1 mg Oral Once Nelva Bush, MD      . magnesium citrate solution 1 Bottle  1 Bottle Oral Once Alexis Frock, MD        No Known Allergies   Assessment/Plan:  1. Hypertension - BP above goal <130/65mmHg. Will start chlorthalidone  25mg  daily and switch metoprolol to carvedilol 12.5mg  BID for better BP control. Will continue lisinopril 40mg  and amlodipine 10mg , pt instructed to move lisinopril to evening dosing to space apart BP medications. Will f/u in clinic in 10-14 days for follow up BMET and BP check.   Aneli Zara E. My Rinke, PharmD, CPP, [place] [address] [place], [place] [address] Phone: [phone]; Fax: [phone] 08/29/2016 10:42 AM

## 2016-08-29 NOTE — Patient Instructions (Addendum)
Start taking chlorthalidone 25mg  once a day  Stop taking metoprolol. Start taking carvedilol 12.5mg  twice a day  Continue taking your amlodipine and lisinopril. Move your lisinopril to the evening  Follow up in clinic in 10 days.   Morning BP medications: - Amlodipine 10mg   - Chlorthalidone 25mg  - Carvedilol 12.5mg   Evening BP medications: - Lisinopril 40mg  - Carvedilol 12.5mg 

## 2016-09-11 ENCOUNTER — Other Ambulatory Visit: Payer: BLUE CROSS/BLUE SHIELD | Admitting: *Deleted

## 2016-09-11 ENCOUNTER — Ambulatory Visit (INDEPENDENT_AMBULATORY_CARE_PROVIDER_SITE_OTHER): Payer: BLUE CROSS/BLUE SHIELD | Admitting: Pharmacist

## 2016-09-11 VITALS — BP 130/84 | HR 63

## 2016-09-11 DIAGNOSIS — I10 Essential (primary) hypertension: Secondary | ICD-10-CM | POA: Diagnosis not present

## 2016-09-11 LAB — BASIC METABOLIC PANEL
BUN/Creatinine Ratio: 14 (ref 9–20)
BUN: 22 mg/dL — ABNORMAL HIGH (ref 6–20)
CALCIUM: 10.1 mg/dL (ref 8.7–10.2)
CO2: 24 mmol/L (ref 18–29)
Chloride: 94 mmol/L — ABNORMAL LOW (ref 96–106)
Creatinine, Ser: 1.59 mg/dL — ABNORMAL HIGH (ref 0.76–1.27)
GFR, EST AFRICAN AMERICAN: 64 mL/min/{1.73_m2} (ref >59)
GFR, EST NON AFRICAN AMERICAN: 55 mL/min/{1.73_m2} — AB (ref >59)
Glucose: 99 mg/dL (ref 65–99)
Potassium: 5 mmol/L (ref 3.5–5.2)
SODIUM: 135 mmol/L (ref 134–144)

## 2016-09-11 NOTE — Patient Instructions (Signed)
Return for a follow up appointment in 4 weeks   Check your blood pressure at home daily (if able) and keep record of the readings.  Take your BP meds as follows: Chlorthalidone 25mg  daily  Carvedilol 12.5mg  twice day Amlodipine 10mg  daily Lisinopril 40mg  daily   Bring all of your meds, your BP cuff and your record of home blood pressures to your next appointment.  Exercise as you're able, try to walk approximately 30 minutes per day.  Keep salt intake to a minimum, especially watch canned and prepared boxed foods.  Eat more fresh fruits and vegetables and fewer canned items.  Avoid eating in fast food restaurants.    HOW TO TAKE YOUR BLOOD PRESSURE: . Rest 5 minutes before taking your blood pressure. .  Don't smoke or drink caffeinated beverages for at least 30 minutes before. . Take your blood pressure before (not after) you eat. . Sit comfortably with your back supported and both feet on the floor (don't cross your legs). . Elevate your arm to heart level on a table or a desk. . Use the proper sized cuff. It should fit smoothly and snugly around your bare upper arm. There should be enough room to slip a fingertip under the cuff. The bottom edge of the cuff should be 1 inch above the crease of the elbow. . Ideally, take 3 measurements at one sitting and record the average.

## 2016-09-11 NOTE — Progress Notes (Signed)
Patient ID: Nicholas Larson                 DOB: 12/08/1980                      MRN: [id number]     HPI: Nicholas Larson is a 36 y.o. male patient of Dr. Saunders Revel referred by [name], PA to HTN clinic. PMH is significant for diastolic CHF, HTN, AAA and left iliac artery aneurysm, obesity, and recently discovered right renal mass suspicious for renal cell carcinoma. Pt reports diagnosis of HTN in 2009, sometimes requiring up to 5 BP medications to control. No evidence of significant renal artery stenosis. At his most recent OV in HTN clinic he was started on chlorthalidone 25mg  and his metoprolol was changed to carvedilol 12.5mg  BID.   Patient presents today stating that he has done well since medication changes. He has not checked his blood pressure since his last visit with our clinic because he has been moving to [facility]. He is getting married. He states that he plans to commute to visits here for now.   Current HTN meds:  Chlorthalidone 25mg  daily - am Carvedilol 12.5mg  BID - 5am and 7 pm Amlodipine 10mg  daily - am Lisinopril 40mg  daily - pm  BP goal: <130/53mmHg  Family History: The patient's family history includes Aneurysm in his father and paternal aunt; Cancer in his maternal uncle; Hypertension in his mother.  Social History: Formerly smoked 1 PPD and drank alcohol, quit both in Sept 2017. Denies illicit drug use.  Diet: Snacks throughout the day - works 11 hour shifts. Likes chips and fruit but has been trying to cut back on chips. Drinks 1 soda each day, minimal other caffeine intake.  Exercise: Walks frequently at work.  Home BP readings: has not checked.   Wt Readings from Last 3 Encounters:  08/15/16 224 lb 6.4 oz (101.8 kg)  07/27/16 220 lb 6.4 oz (100 kg)  07/10/16 218 lb (98.9 kg)   BP Readings from Last 3 Encounters:  09/11/16 130/84  08/29/16 (!) 152/104  08/15/16 (!) 172/120   Pulse Readings from Last 3 Encounters:  09/11/16 63  08/29/16 74  08/15/16 74     Renal function: CrCl cannot be calculated (Patient's most recent lab result is older than the maximum 21 days allowed.).  Past Medical History:  Diagnosis Date  . AAA (abdominal aortic aneurysm) ([place])    s/p Repair 05/26/16  . 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 CHF (congestive heart failure) ([place])   . Hypertension   . Right renal mass     Current Outpatient Prescriptions on File Prior to Visit  Medication Sig Dispense Refill  . amLODipine (NORVASC) 10 MG tablet Take 1 tablet (10 mg total) by mouth daily. 30 tablet 5  . carvedilol (COREG) 12.5 MG tablet Take 1 tablet (12.5 mg total) by mouth 2 (two) times daily. 60 tablet 11  . chlorthalidone (HYGROTON) 25 MG tablet Take 1 tablet (25 mg total) by mouth daily. 30 tablet 11  . lisinopril (PRINIVIL,ZESTRIL) 40 MG tablet Take 1 tablet (40 mg total) by mouth daily. 30 tablet 11   Current Facility-Administered Medications on File Prior to Visit  Medication Dose Route Frequency Provider Last Rate Last Dose  . cloNIDine (CATAPRES) tablet 0.1 mg  0.1 mg Oral Once Christopher End, MD      . magnesium citrate solution 1 Bottle  1 Bottle  Oral Once Alexis Frock, MD        No Known Allergies  Blood pressure 130/84, pulse 63.   Assessment/Plan: Hypertension: BP today borderline at goal and significantly improved with recent medication changes about 2 weeks ago. He states he is working on an exercise program and wishes to defer additional medication adjustments at this time. Will have him monitor and follow up in HTN clinic in 4 weeks for additional medication titration.    Thank you, Lelan Pons. Patterson Hammersmith, [facility]  09/11/2016 8:56 AM

## 2016-09-13 ENCOUNTER — Telehealth: Payer: Self-pay | Admitting: Internal Medicine

## 2016-09-13 DIAGNOSIS — I10 Essential (primary) hypertension: Secondary | ICD-10-CM

## 2016-09-13 DIAGNOSIS — I270 Primary pulmonary hypertension: Secondary | ICD-10-CM

## 2016-09-13 NOTE — Telephone Encounter (Signed)
New message ° ° ° ° °Returning a call to the nurse to get lab results °

## 2016-09-13 NOTE — Telephone Encounter (Signed)
Pt is aware of lab results and MD's recommendations, to increased water intake and return in a week for labs Pt  has an appointment for labs for next week (Wednesday) for BMP. Pt is aware .

## 2016-09-20 ENCOUNTER — Other Ambulatory Visit: Payer: BLUE CROSS/BLUE SHIELD | Admitting: *Deleted

## 2016-09-20 DIAGNOSIS — I10 Essential (primary) hypertension: Secondary | ICD-10-CM

## 2016-09-21 LAB — BASIC METABOLIC PANEL
BUN/Creatinine Ratio: 19 (ref 9–20)
BUN: 24 mg/dL — ABNORMAL HIGH (ref 6–20)
CALCIUM: 9.7 mg/dL (ref 8.7–10.2)
CO2: 24 mmol/L (ref 18–29)
CREATININE: 1.25 mg/dL (ref 0.76–1.27)
Chloride: 99 mmol/L (ref 96–106)
GFR calc Af Amer: 85 mL/min/{1.73_m2} (ref >59)
GFR, EST NON AFRICAN AMERICAN: 74 mL/min/{1.73_m2} (ref >59)
GLUCOSE: 122 mg/dL — AB (ref 65–99)
Potassium: 4.4 mmol/L (ref 3.5–5.2)
Sodium: 141 mmol/L (ref 134–144)

## 2016-10-09 ENCOUNTER — Ambulatory Visit (INDEPENDENT_AMBULATORY_CARE_PROVIDER_SITE_OTHER): Payer: BLUE CROSS/BLUE SHIELD | Admitting: Pharmacist

## 2016-10-09 VITALS — BP 126/82 | HR 84

## 2016-10-09 DIAGNOSIS — I10 Essential (primary) hypertension: Secondary | ICD-10-CM

## 2016-10-09 NOTE — Patient Instructions (Addendum)
It was great to see you today.  Your blood pressure today was great!!  Continue to take your medications as you have been and give [place] a call if you have any questions or would like to see [place] again.

## 2016-10-09 NOTE — Progress Notes (Signed)
Patient ID: Nicholas Larson                 DOB: 1980-12-10                      MRN: [id number]     HPI: Nicholas Larson is a 36 y.o. male patient of Dr. Saunders Revel referred by [name], PAto HTN clinic who presents today for follow up.PMH is significant for diastolic CHF, HTN, AAA and left iliac artery aneurysm, obesity, and recently discovered right renal mass suspicious for renal cell carcinoma. Pt reports diagnosis of HTN in 2009, sometimes requiring up to 5 BP medications to control. No evidence of significant renal artery stenosis. At his last visit in HTN clinic on 09/11/2016 his BP was near goal and pt reported starting a new exercise program. Medication adjustments were deferred.  Patient presents today with no complaints. Overall doing well since moving to [place], [place] about 3 weeks ago. Drove an hour and half to come and see [place] today. Denied any dizziness or lightheadedness. Was recently on vacation to [place] but has checked his blood pressure a few times at home, usually before he takes his medications. Because of this, BP readings a bit elevated at 140/80s. Since moving, he has not started a new job and many of his snacking habits have stopped. He is no longer eating chips or drinking sodas, and mainly cooks at home and drinks water. Has also been adherent to a new exercise program.  Current HTN meds:  Chlorthalidone 25mg  daily - am Carvedilol 12.5mg  BID - 5am and 7 pm Amlodipine 10mg  daily - am Lisinopril 40mg  daily - pm  BP goal: <130/65mmHg  Family History: The patient's family history includes Aneurysm in his father and paternal aunt; Cancer in his maternal uncle; Hypertension in his mother.  Social History: Formerly smoked 1 PPD and drank alcohol, quit both in Sept 2017. Denies illicit drug use.  Diet:Likes chips and fruit but has been trying to cut back on chips. Limited soda intake, only drinking water now. Limiting salt intake while at home.  Exercise:Started an exercise  program at home, mainly cardio 3mins a day about 3 days a week.  Home BP readings: 140s/80s - was usually checking before he took his medications  Wt Readings from Last 3 Encounters:  08/15/16 224 lb 6.4 oz (101.8 kg)  07/27/16 220 lb 6.4 oz (100 kg)  07/10/16 218 lb (98.9 kg)   BP Readings from Last 3 Encounters:  09/11/16 130/84  08/29/16 (!) 152/104  08/15/16 (!) 172/120   Pulse Readings from Last 3 Encounters:  09/11/16 63  08/29/16 74  08/15/16 74    Renal function: CrCl cannot be calculated (Unknown ideal weight.).  Past Medical History:  Diagnosis Date  . AAA (abdominal aortic aneurysm) ([place])    s/p Repair 05/26/16  . 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 CHF (congestive heart failure) ([place])   . Hypertension   . Right renal mass     Current Outpatient Prescriptions on File Prior to Visit  Medication Sig Dispense Refill  . amLODipine (NORVASC) 10 MG tablet Take 1 tablet (10 mg total) by mouth daily. 30 tablet 5  . carvedilol (COREG) 12.5 MG tablet Take 1 tablet (12.5 mg total) by mouth 2 (two) times daily. 60 tablet 11  . chlorthalidone (HYGROTON) 25 MG tablet Take 1 tablet (25 mg total) by mouth daily. 30 tablet 11  .  lisinopril (PRINIVIL,ZESTRIL) 40 MG tablet Take 1 tablet (40 mg total) by mouth daily. 30 tablet 11   Current Facility-Administered Medications on File Prior to Visit  Medication Dose Route Frequency Provider Last Rate Last Dose  . cloNIDine (CATAPRES) tablet 0.1 mg  0.1 mg Oral Once End, Harrell Gave, MD      . magnesium citrate solution 1 Bottle  1 Bottle Oral Once Alexis Frock, MD        No Known Allergies   Assessment/Plan:  1. Hypertension - BP now at goal < 130/14mmHg. Pt has no complaints and is tolerating current medications well. Continue chlorthalidone 25mg  daily, carvedilol 12.5mg  BID, amlodipine 10mg  daily, and lisinopril 40mg  daily. Instructed patient to continue with current diet and exercise  routine and to start taking BP 2 hours after taking medications instead of before. Pt agreed that he needs to find a doctor closer to his new home and is in the process of doing so. Until that time, instructed patient that he could call us with any issues.   Melburn Popper, PharmD Clinical Pharmacy Resident Pager: [phone] 10/09/16 8:08 AM  Megan E. Supple, PharmD, CPP, [place] [address], [place], [place] [address] Phone: [phone]; Fax: [phone] 08/29/2016 10:42 AM

## 2016-10-26 NOTE — Progress Notes (Signed)
07-05-16 (EPIC) EKG 05-15-16 (EPIC) ECHO

## 2016-10-26 NOTE — Patient Instructions (Signed)
Dennie Moltz  10/26/2016   Your procedure is scheduled on: 11-01-16   Report to [facility] Main  Entrance Take [place] Elevators to 3rd floor to [place] at 06:30 AM.   Call this number if you have problems the morning of surgery [phone]    Remember: ONLY 1 PERSON MAY GO WITH YOU TO SHORT STAY TO GET  READY MORNING OF [place].  Do not eat food or drink liquids :After Midnight.     Take these medicines the morning of surgery with A SIP OF WATER: Amlodipine (Norvasc), Carvedilol (Coreg) and [place]                                You may not have any metal on your body including hair pins and              piercings  Do not wear jewelry, make-up, lotions, powders or perfumes, deodorant             Men may shave face and neck.   Do not bring valuables to the hospital. [place].  Contacts, dentures or bridgework may not be worn into surgery.  Leave suitcase in the car. After surgery it may be brought to your room.     Please read over the following fact sheets you were given: _____________________________________________________________________  [facility] - Preparing for Surgery Before surgery, you can play an important role.  Because skin is not sterile, your skin needs to be as free of germs as possible.  You can reduce the number of germs on your skin by washing with CHG (chlorahexidine gluconate) soap before surgery.  CHG is an antiseptic cleaner which kills germs and bonds with the skin to continue killing germs even after washing. Please DO NOT use if you have an allergy to CHG or antibacterial soaps.  If your skin becomes reddened/irritated stop using the CHG and inform your nurse when you arrive at Short Stay. Do not shave (including legs and underarms) for at least 48 hours prior to the first CHG shower.  You may shave your face/neck. Please follow these instructions carefully:  1.  Shower  with CHG Soap the night before surgery and the  morning of Surgery.  2.  If you choose to wash your hair, wash your hair first as usual with your  normal  shampoo.  3.  After you shampoo, rinse your hair and body thoroughly to remove the  shampoo.                           4.  Use CHG as you would any other liquid soap.  You can apply chg directly  to the skin and wash                       Gently with a scrungie or clean washcloth.  5.  Apply the CHG Soap to your body ONLY FROM THE NECK DOWN.   Do not use on face/ open                           Wound or open sores. Avoid  contact with eyes, ears mouth and genitals (private parts).                       Wash face,  Genitals (private parts) with your normal soap.             6.  Wash thoroughly, paying special attention to the area where your surgery  will be performed.  7.  Thoroughly rinse your body with warm water from the neck down.  8.  DO NOT shower/wash with your normal soap after using and rinsing off  the CHG Soap.                9.  Pat yourself dry with a clean towel.            10.  Wear clean pajamas.            11.  Place clean sheets on your bed the night of your first shower and do not  sleep with pets. Day of Surgery : Do not apply any lotions/deodorants the morning of surgery.  Please wear clean clothes to the hospital/surgery center.  FAILURE TO FOLLOW THESE INSTRUCTIONS MAY RESULT IN THE CANCELLATION OF YOUR SURGERY PATIENT SIGNATURE_________________________________  NURSE SIGNATURE__________________________________  ________________________________________________________________________

## 2016-10-30 ENCOUNTER — Encounter (HOSPITAL_COMMUNITY)
Admission: RE | Admit: 2016-10-30 | Discharge: 2016-10-30 | Disposition: A | Discharge status: Home / Self Care | Payer: BLUE CROSS/BLUE SHIELD | Source: Ambulatory Visit | Attending: Urology | Admitting: Urology

## 2016-11-01 ENCOUNTER — Inpatient Hospital Stay (HOSPITAL_COMMUNITY): Admission: RE | Admit: 2016-11-01 | Payer: BLUE CROSS/BLUE SHIELD | Source: Ambulatory Visit | Admitting: Urology

## 2016-11-01 ENCOUNTER — Encounter (HOSPITAL_COMMUNITY): Admission: RE | Payer: Self-pay | Source: Ambulatory Visit

## 2016-11-01 SURGERY — NEPHRECTOMY, PARTIAL, ROBOT-ASSISTED
Anesthesia: General | Laterality: Right

## 2016-12-12 ENCOUNTER — Other Ambulatory Visit: Payer: Self-pay | Admitting: Internal Medicine

## 2017-02-11 ENCOUNTER — Other Ambulatory Visit: Payer: Self-pay | Admitting: Internal Medicine

## 2017-02-11 ENCOUNTER — Other Ambulatory Visit: Payer: Self-pay | Admitting: Physician Assistant

## 2017-02-12 NOTE — Telephone Encounter (Signed)
Please review for refills. Thanks!

## 2017-03-28 ENCOUNTER — Ambulatory Visit: Payer: BLUE CROSS/BLUE SHIELD | Admitting: Vascular Surgery

## 2017-04-04 ENCOUNTER — Ambulatory Visit (INDEPENDENT_AMBULATORY_CARE_PROVIDER_SITE_OTHER): Payer: Managed Care, Other (non HMO) | Admitting: Vascular Surgery

## 2017-04-04 ENCOUNTER — Encounter: Payer: Self-pay | Admitting: Vascular Surgery

## 2017-04-04 VITALS — BP 122/76 | HR 69 | Temp 98.8°F | Resp 16 | Ht 64.0 in | Wt 223.0 lb

## 2017-04-04 DIAGNOSIS — I714 Abdominal aortic aneurysm, without rupture: Secondary | ICD-10-CM | POA: Diagnosis not present

## 2017-04-04 NOTE — Progress Notes (Signed)
Patient name: Nicholas Larson MRN: [id number] DOB: 05/03/1981 Sex: male  REASON FOR VISIT:   Follow-up after endovascular aneurysm repair.  HPI:   Nicholas Larson is a pleasant 36 y.o. male who presented with the acute onset of left groin pain.  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  I felt this was the source of his pain.  He underwent a semi-urgent endovascular aneurysm repair and coil embolization of the left hypogastric artery on 05/26/2016.  I last saw the patient on 06/28/2016.  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  Patient comes in for a 29-month follow-up visit.  Unfortunately he did not have his CAT scan today.  He denies any abdominal pain or back pain.  There have been no significant changes in his medical history.  Past Medical History:  Diagnosis Date  . AAA (abdominal aortic aneurysm) ([place])    s/p Repair 05/26/16  . 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 CHF (congestive heart failure) ([place])   . Hypertension   . Right renal mass     Family History  Problem Relation Age of Onset  . Hypertension Mother   . Aneurysm Father        head  . Cancer Maternal Uncle   . Aneurysm Paternal Aunt     SOCIAL HISTORY: Social History   Tobacco Use  . Smoking status: Former Smoker    Packs/day: 1.00    Types: Cigarettes    Last attempt to quit: 01/2016    Years since quitting: 1.1  . Smokeless tobacco: Never Used  Substance Use Topics  . Alcohol use: No    Comment: last drink 01/2016    No Known Allergies  Current Outpatient Medications  Medication Sig Dispense Refill  . amLODipine (NORVASC) 10 MG tablet TAKE 1 TABLET EVERY DAY 30 tablet 5  . carvedilol (COREG) 12.5 MG tablet TAKE 1 TABLET BY MOUTH TWICE A DAY 60  tablet 5  . chlorthalidone (HYGROTON) 25 MG tablet TAKE 1 TABLET BY MOUTH EVERY DAY 30 tablet 5  . lisinopril (PRINIVIL,ZESTRIL) 40 MG tablet Take 1 tablet (40 mg total) by mouth daily. 30 tablet 11   Current Facility-Administered Medications  Medication Dose Route Frequency Provider Last Rate Last Dose  . cloNIDine (CATAPRES) tablet 0.1 mg  0.1 mg Oral Once End, Harrell Gave, MD       Facility-Administered Medications Ordered in Other Visits  Medication Dose Route Frequency Provider Last Rate Last Dose  . magnesium citrate solution 1 Bottle  1 Bottle Oral Once Alexis Frock, MD        REVIEW OF SYSTEMS:  [X]  denotes positive finding, [ ]  denotes negative finding Cardiac  Comments:  Chest pain or chest pressure:    Shortness of breath upon exertion:    Short of breath when lying flat:    Irregular heart rhythm:        Vascular    Pain in calf, thigh, or hip brought on by ambulation:    Pain in feet at night that wakes you up from your sleep:     Blood clot in your veins:    Leg swelling:         Pulmonary  Oxygen at home:    Productive cough:     Wheezing:         Neurologic    Sudden weakness in arms or legs:     Sudden numbness in arms or legs:     Sudden onset of difficulty speaking or slurred speech:    Temporary loss of vision in one eye:     Problems with dizziness:         Gastrointestinal    Blood in stool:     Vomited blood:         Genitourinary    Burning when urinating:     Blood in urine:        Psychiatric    Major depression:         Hematologic    Bleeding problems:    Problems with blood clotting too easily:        Skin    Rashes or ulcers:        Constitutional    Fever or chills:     PHYSICAL EXAM:   Vitals:   04/04/17 1349  BP: 122/76  Pulse: 69  Resp: 16  Temp: 98.8 F (37.1 C)  TempSrc: Oral  SpO2: 97%  Weight: 223 lb (101.2 kg)  Height: 5\' 4"  (1.626 m)    GENERAL: The patient is a well-nourished male, in no acute  distress. The vital signs are documented above. CARDIAC: There is a regular rate and rhythm.  VASCULAR: He has palpable femoral pulses and pedal pulses bilaterally. PULMONARY: There is good air exchange bilaterally without wheezing or rales. ABDOMEN: Soft and non-tender with normal pitched bowel sounds.  MUSCULOSKELETAL: There are no major deformities or cyanosis. NEUROLOGIC: No focal weakness or paresthesias are detected. SKIN: There are no ulcers or rashes noted. PSYCHIATRIC: The patient has a normal affect.  DATA:    No new data  MEDICAL ISSUES:   STATUS POST EVAR: Unfortunately he did not have his CAT scan today so I cannot give him any information concerning his graft.  We will schedule a CT scan and I will see him back after this to discuss those results.  [name] [facility] [place] [phone]

## 2017-04-25 ENCOUNTER — Ambulatory Visit: Payer: Managed Care, Other (non HMO) | Admitting: Vascular Surgery

## 2017-04-25 ENCOUNTER — Encounter: Payer: Self-pay | Admitting: Vascular Surgery

## 2017-04-25 ENCOUNTER — Ambulatory Visit
Admission: RE | Admit: 2017-04-25 | Discharge: 2017-04-25 | Disposition: A | Discharge status: Home / Self Care | Payer: Managed Care, Other (non HMO) | Source: Ambulatory Visit | Attending: Vascular Surgery | Admitting: Vascular Surgery

## 2017-04-25 VITALS — BP 116/78 | HR 61 | Temp 99.1°F | Resp 16 | Ht 64.0 in | Wt 223.0 lb

## 2017-04-25 DIAGNOSIS — I714 Abdominal aortic aneurysm, without rupture, unspecified: Secondary | ICD-10-CM

## 2017-04-25 DIAGNOSIS — Z48812 Encounter for surgical aftercare following surgery on the circulatory system: Secondary | ICD-10-CM

## 2017-04-25 MED ORDER — IOPAMIDOL (ISOVUE-370) INJECTION 76%
75.0000 mL | Freq: Once | INTRAVENOUS | Status: AC | PRN
  Administered 2017-04-25: 75 mL via INTRAVENOUS

## 2017-04-25 NOTE — Progress Notes (Signed)
History of Present Illness:  Patient is a 36 y.o. year old male who presents for placement for follow up CTA  Of abdomen and pelvis s/p EVAR  and coil embolization of the left hypogastric 05/26/2016.   He dis have a type II endo leak from inferior mesenteric artery but no change in the size of the aneurysm.  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   He has had no lumbar or abdominal pain, no CP or SOB.  He is maintaining BP control with NORVASC and PRINIVIL.  Past Medical History:  Diagnosis Date  . AAA (abdominal aortic aneurysm) ([place])    s/p Repair 05/26/16  . 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 CHF (congestive heart failure) ([place])   . Hypertension   . Right renal mass     Past Surgical History:  Procedure Laterality Date  . ABDOMINAL AORTIC ENDOVASCULAR STENT GRAFT N/A 05/26/2016   Procedure: ABDOMINAL AORTIC ENDOVASCULAR STENT GRAFT;  Surgeon: Angelia Mould, MD;  Location: [facility];  Service: Vascular;  Laterality: N/A;  . EMBOLIZATION Left 05/26/2016   Procedure: COIL EMBOLIZATION LEFT HYPOGASTRIC ARTERY;  Surgeon: Angelia Mould, MD;  Location: [facility] OR;  Service: Vascular;  Laterality: Left;     Social History Social History   Tobacco Use  . Smoking status: Former Smoker    Packs/day: 1.00    Types: Cigarettes    Last attempt to quit: 01/2016    Years since quitting: 1.2  . Smokeless tobacco: Never Used  Substance Use Topics  . Alcohol use: No    Comment: last drink 01/2016  . Drug use: No    Family History Family History  Problem Relation Age of Onset  . Hypertension Mother   . Aneurysm Father        head  . Cancer Maternal Uncle   . Aneurysm Paternal Aunt     Allergies  No Known Allergies   Current Outpatient Medications  Medication Sig Dispense Refill  . amLODipine (NORVASC) 10 MG tablet TAKE 1 TABLET EVERY DAY 30 tablet 5  . carvedilol (COREG) 12.5 MG tablet TAKE 1 TABLET BY MOUTH TWICE A  DAY 60 tablet 5  . chlorthalidone (HYGROTON) 25 MG tablet TAKE 1 TABLET BY MOUTH EVERY DAY 30 tablet 5  . lisinopril (PRINIVIL,ZESTRIL) 40 MG tablet Take 1 tablet (40 mg total) by mouth daily. 30 tablet 11   Current Facility-Administered Medications  Medication Dose Route Frequency Provider Last Rate Last Dose  . cloNIDine (CATAPRES) tablet 0.1 mg  0.1 mg Oral Once End, Harrell Gave, MD       Facility-Administered Medications Ordered in Other Visits  Medication Dose Route Frequency Provider Last Rate Last Dose  . magnesium citrate solution 1 Bottle  1 Bottle Oral Once Alexis Frock, MD        ROS:   General:  No weight loss, Fever, chills  HEENT: No recent headaches, no nasal bleeding, no visual changes, no sore throat  Neurologic: No dizziness, blackouts, seizures. No recent symptoms of stroke or mini- stroke. No recent episodes of slurred speech, or temporary blindness.  Cardiac: No recent episodes of chest pain/pressure, no shortness of breath at rest.  No shortness of breath with exertion.  Denies history of atrial fibrillation or irregular heartbeat  Vascular: No history of rest pain in feet.  No history of claudication.  No history of non-healing ulcer, No history of DVT  Pulmonary: No home oxygen, no productive cough, no hemoptysis,  No asthma or wheezing  Musculoskeletal:  [ ]  Arthritis, [ ]  Low back pain,  [ ]  Joint pain  Hematologic:No history of hypercoagulable state.  No history of easy bleeding.  No history of anemia  Gastrointestinal: No hematochezia or melena,  No gastroesophageal reflux, no trouble swallowing  Urinary: [ ]  chronic Kidney disease, [ ]  on HD - [ ]  MWF or [ ]  TTHS, [ ]  Burning with urination, [ ]  Frequent urination, [ ]  Difficulty urinating;   Skin: No rashes  Psychological: No history of anxiety,  No history of depression   Physical Examination  Vitals:   04/25/17 1320  BP: 116/78  Pulse: 61  Resp: 16  Temp: 99.1 F (37.3 C)  TempSrc:  Oral  SpO2: 95%  Weight: 223 lb (101.2 kg)  Height: 5\' 4"  (1.626 m)    Body mass index is 38.28 kg/m.  General:  Alert and oriented, no acute distress HEENT: Normal Neck: No bruit or JVD Pulmonary: Clear to auscultation bilaterally Cardiac: Regular Rate and Rhythm without murmur Gastrointestinal: Soft, non-tender, non-distended, no mass, no scars Skin: No rash Extremity Pulses:  2+ radial, femoral, DP/PT pulses bilat Musculoskeletal: No deformity or edema  Neurologic: Upper and lower extremity motor 5/5 and symmetric  DATA:  CTA Abdomen and pelvis; IMPRESSION: Status post aortic stent graft repair of the infrarenal abdominal aortic aneurysm extending into the left common iliac artery. 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 Native aneurysm sac measures 3.5 cm transverse and 2.7 cm in AP dimension  Pre op the Native aneurysm sac measures 4.0 cm  ASSESSMENT:  AAA s/p EVAR  PLAN: The Native aneurysm sac is shrinking despite a type II endoleak.  At this time we do recommend a daily 81 mg Asa.  Maintain good BP control and f/u in 6 months for Ultrasound.  With Dr. Scot Dock.  Roxy Horseman PA-C [facility]  The patient was seen in conjunction with Dr. Scot Dock

## 2017-04-26 NOTE — Addendum Note (Signed)
Addended by: Lianne Cure A on: 04/26/2017 12:53 PM   Modules accepted: Orders

## 2017-06-11 ENCOUNTER — Other Ambulatory Visit: Payer: Self-pay | Admitting: Physician Assistant

## 2017-06-11 ENCOUNTER — Other Ambulatory Visit: Payer: Self-pay | Admitting: Internal Medicine

## 2017-06-11 DIAGNOSIS — I10 Essential (primary) hypertension: Secondary | ICD-10-CM

## 2017-06-11 MED ORDER — CARVEDILOL 12.5 MG PO TABS: 12.5000 mg | ORAL_TABLET | Freq: Two times a day (BID) | ORAL | 1 refills | Status: DC

## 2017-06-11 MED ORDER — CHLORTHALIDONE 25 MG PO TABS: 25.0000 mg | ORAL_TABLET | Freq: Every day | ORAL | 1 refills | Status: DC

## 2017-06-20 IMAGING — MR MR MRA HEAD W/O CM
2 series · 20 of 48 positions shown · non-contrast
Comparison: None.

CLINICAL DATA: History of abdominal aortic aneurysm. Family history
of cerebral aneurysms.

EXAM:
MRA HEAD WITHOUT CONTRAST
TECHNIQUE: Angiographic images of the Circle of Willis were obtained using MRA
technique without intravenous contrast.

[Series 3: (id) mt fs · axial · 1.2mm · 0.43mm/px · z∈[-65,+29]mm · 19 of 166 slices shown]
[im 1/166]
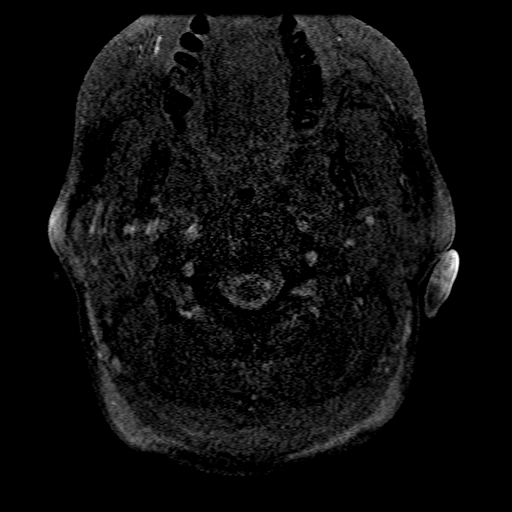
[im 4/166]
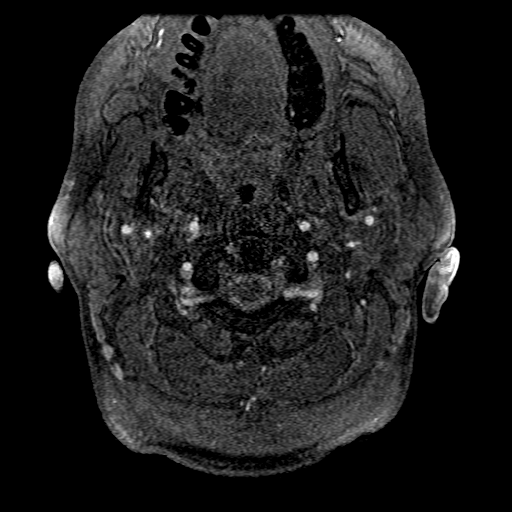
[im 8/166]
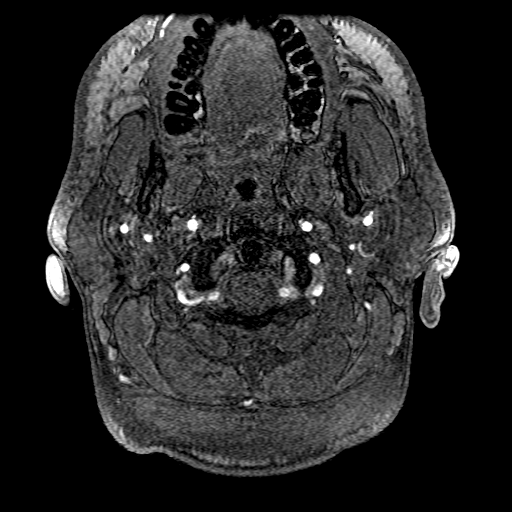
[im 11/166]
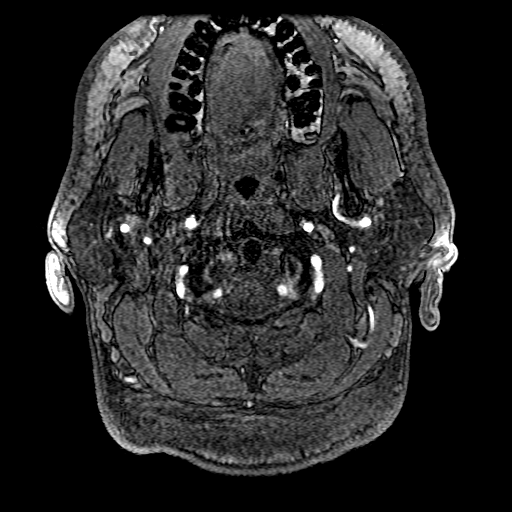
[im 15/166]
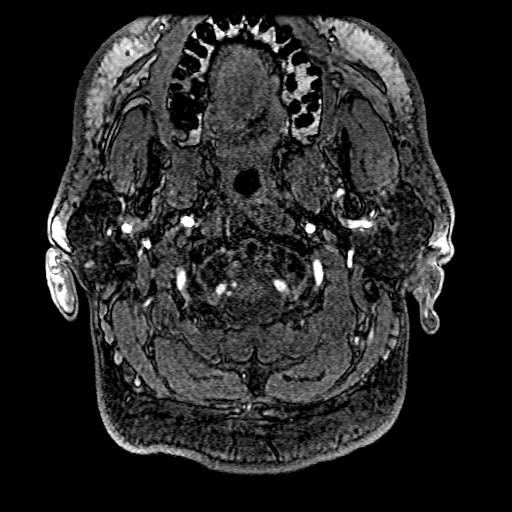
[im 18/166]
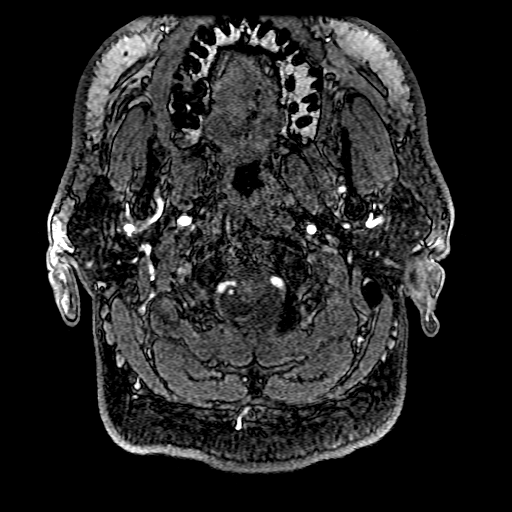
[im 22/166]
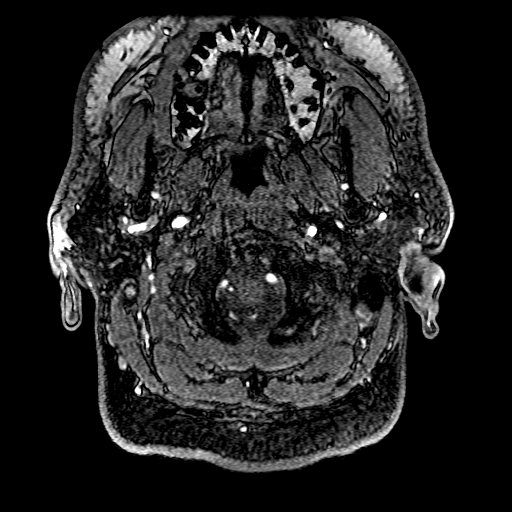
[im 26/166]
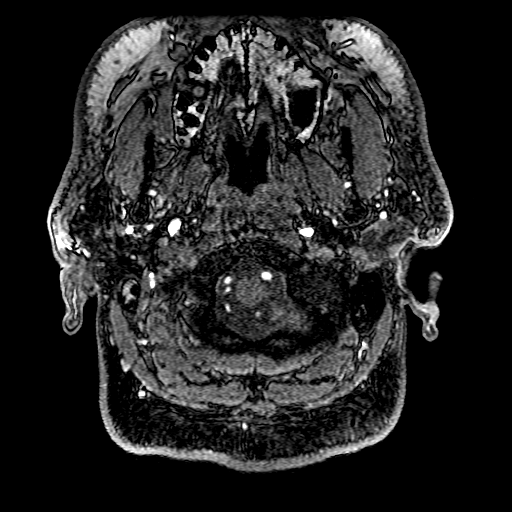
[im 29/166]
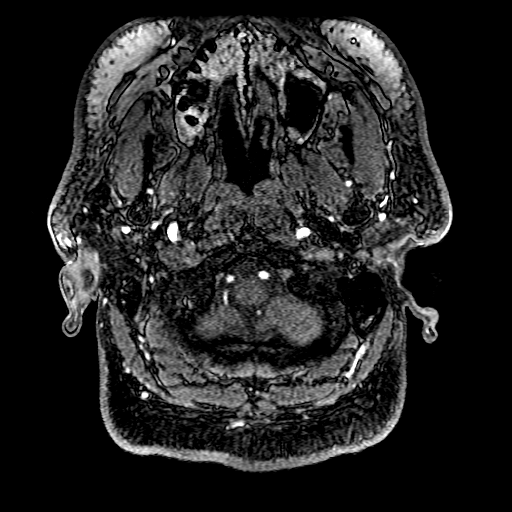
[im 33/166]
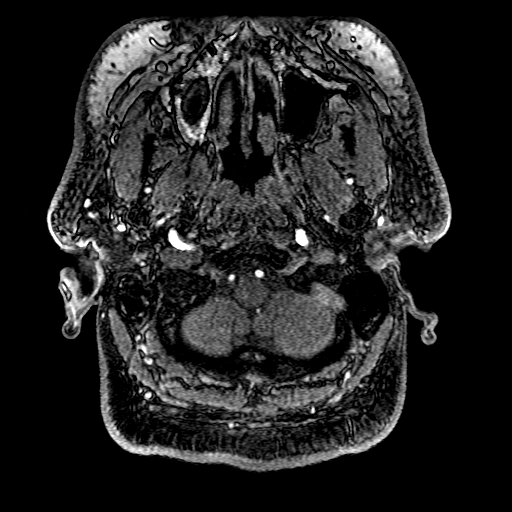
[im 36/166]
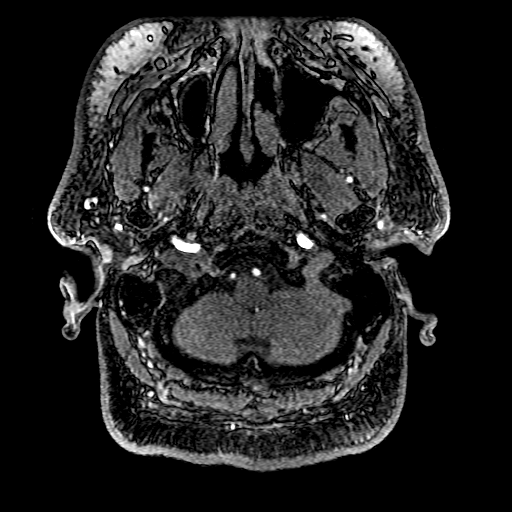
[im 51/166]
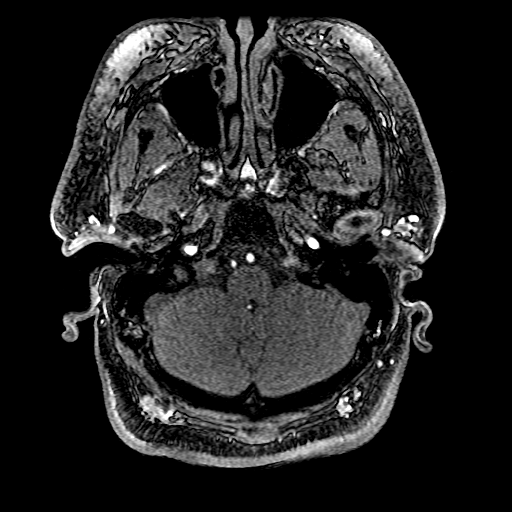
[im 72/166]
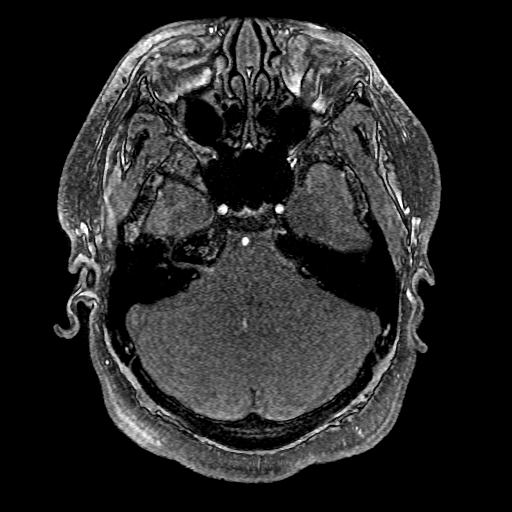
[im 83/166]
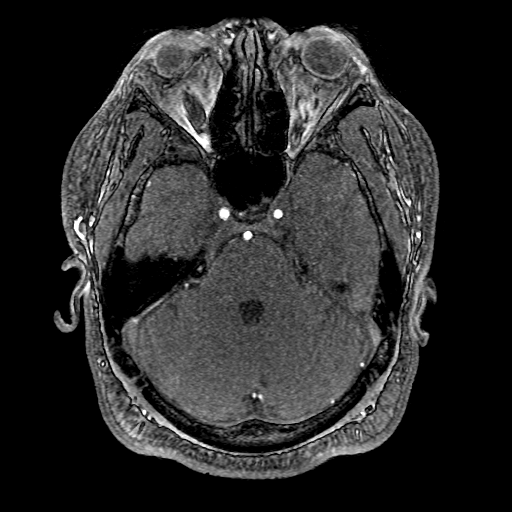
[im 94/166]
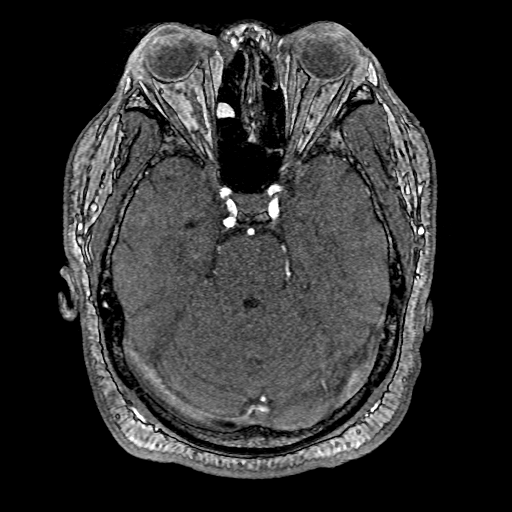
[im 115/166]
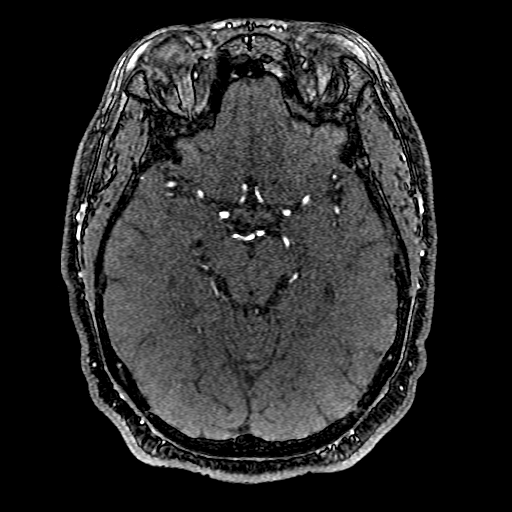
[im 137/166]
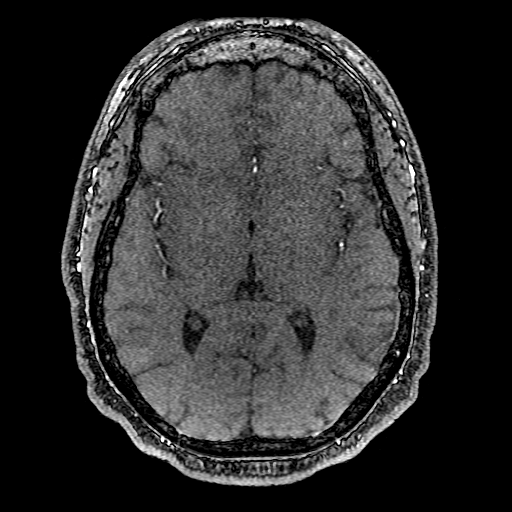
[im 140/166]
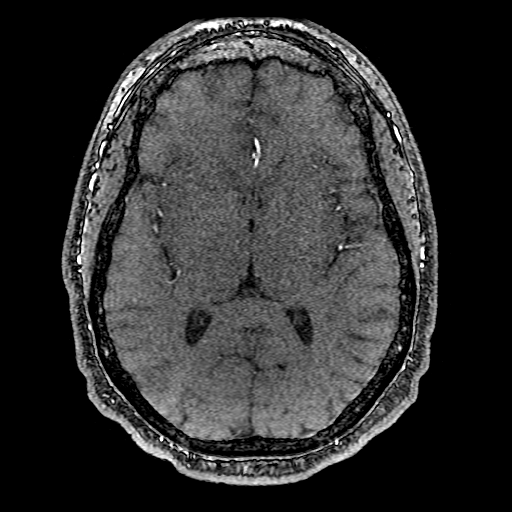
[im 158/166]
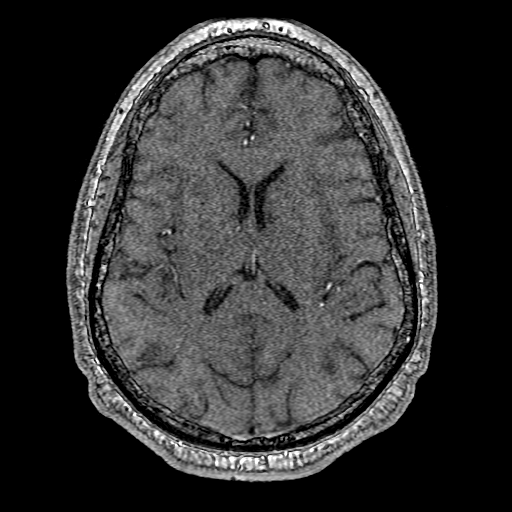

[Series 303: processed images · axial · 1.2mm · 0.43mm/px · 1 of 2 slices shown]
[im 1/2]
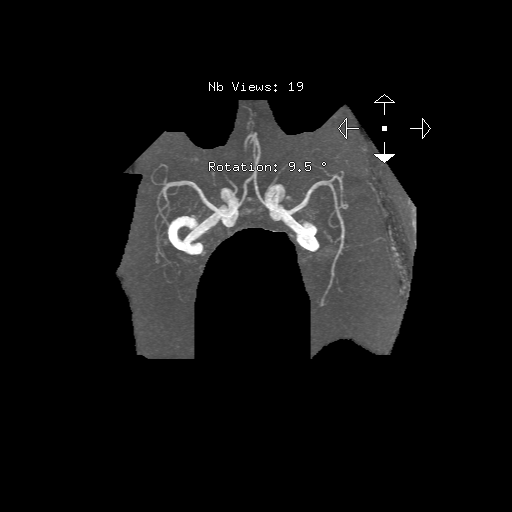

[20 of 48 positions shown; findings below may reference images not displayed]

FINDINGS: Symmetric carotid arteries. No evidence of vasculopathy in the
visible cervical ICA. Incomplete circle-of-Willis with no visible
communicating arteries. Left dominant vertebral artery. No evidence
of aneurysm. No flow limiting stenosis or major branch occlusion. No
vessel beading.
IMPRESSION: Negative intracranial MRA.

## 2017-08-06 ENCOUNTER — Other Ambulatory Visit: Payer: Self-pay | Admitting: Internal Medicine

## 2017-08-06 DIAGNOSIS — I10 Essential (primary) hypertension: Secondary | ICD-10-CM

## 2017-08-06 MED ORDER — LISINOPRIL 40 MG PO TABS: 40.0000 mg | ORAL_TABLET | Freq: Every day | ORAL | 0 refills | Status: AC

## 2017-08-06 MED ORDER — CARVEDILOL 12.5 MG PO TABS: 12.5000 mg | ORAL_TABLET | Freq: Two times a day (BID) | ORAL | 0 refills | Status: DC

## 2017-08-06 MED ORDER — CHLORTHALIDONE 25 MG PO TABS: 25.0000 mg | ORAL_TABLET | Freq: Every day | ORAL | 0 refills | Status: AC

## 2017-08-18 ENCOUNTER — Other Ambulatory Visit: Payer: Self-pay | Admitting: Internal Medicine

## 2017-08-20 NOTE — Telephone Encounter (Signed)
Please review for refill. Thanks!  

## 2017-10-24 ENCOUNTER — Ambulatory Visit (HOSPITAL_COMMUNITY)
Admission: RE | Admit: 2017-10-24 | Discharge: 2017-10-24 | Disposition: A | Discharge status: Home / Self Care | Payer: Managed Care, Other (non HMO) | Source: Ambulatory Visit | Attending: Vascular Surgery | Admitting: Vascular Surgery

## 2017-10-24 ENCOUNTER — Other Ambulatory Visit: Payer: Self-pay

## 2017-10-24 ENCOUNTER — Ambulatory Visit (INDEPENDENT_AMBULATORY_CARE_PROVIDER_SITE_OTHER): Payer: Managed Care, Other (non HMO) | Admitting: Vascular Surgery

## 2017-10-24 ENCOUNTER — Encounter: Payer: Self-pay | Admitting: Vascular Surgery

## 2017-10-24 VITALS — BP 117/78 | HR 62 | Resp 20 | Ht 64.0 in | Wt 231.0 lb

## 2017-10-24 DIAGNOSIS — I714 Abdominal aortic aneurysm, without rupture, unspecified: Secondary | ICD-10-CM

## 2017-10-24 NOTE — Progress Notes (Signed)
Patient name: Nicholas Larson MRN: [id number] DOB: [birth year] Sex: male  REASON FOR VISIT:   Follow-up after endovascular aneurysm repair  HPI:   Nicholas Larson is a pleasant 37 y.o. 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  He also required coil embolization of the left hypogastric artery.  This was on 05/26/2016.  He had presented with the acute onset of left groin pain.  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  There was no change in the size of the aneurysm.  He was last seen in our office on 04/25/2017.  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  The native aneurysm sac measured 3.5 cm in maximum diameter.  The aneurysm was 4 cm in maximum diameter preoperatively.  Since I saw the patient last he denies any abdominal pain or back pain.  There have been no significant changes in his medical history.  Past Medical History:  Diagnosis Date  . AAA (abdominal aortic aneurysm) ([place])    s/p Repair 05/26/16  . 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 CHF (congestive heart failure) ([place])   . Hypertension   . Right renal mass     Family History  Problem Relation Age of Onset  . Hypertension Mother   . Aneurysm Father        head  . Cancer Maternal Uncle   . Aneurysm Paternal Aunt     SOCIAL HISTORY: Social History   Tobacco Use  . Smoking status: Former Smoker    Packs/day: 1.00    Types: Cigarettes    Last attempt to quit: 01/2016    Years since quitting: 1.7  . Smokeless tobacco: Never Used  Substance Use Topics  . Alcohol use: No    Comment: last drink 01/2016    No Known Allergies  Current Outpatient Medications  Medication Sig Dispense Refill  . amLODipine  (NORVASC) 10 MG tablet TAKE 1 TABLET EVERY DAY 30 tablet 1  . carvedilol (COREG) 12.5 MG tablet Take 1 tablet (12.5 mg total) by mouth 2 (two) times daily with a meal. Please make overdue appt with Dr.End for future refills 2nd attempt 30 tablet 0  . chlorthalidone (HYGROTON) 25 MG tablet Take 1 tablet (25 mg total) by mouth daily. Please make overdue yearly appt with Dr. Saunders Revel before anymore refills. 2nd attempt 15 tablet 0  . lisinopril (PRINIVIL,ZESTRIL) 40 MG tablet Take 1 tablet (40 mg total) by mouth daily. Please make overdue yearly appt with Dr. Saunders Revel before anymore refills. 2nd attempt 15 tablet 0   Current Facility-Administered Medications  Medication Dose Route Frequency Provider Last Rate Last Dose  . cloNIDine (CATAPRES) tablet 0.1 mg  0.1 mg Oral Once End, Harrell Gave, MD       Facility-Administered Medications Ordered in Other Visits  Medication Dose Route Frequency Provider Last Rate Last Dose  . magnesium citrate solution 1 Bottle  1 Bottle Oral Once Alexis Frock, MD        REVIEW OF SYSTEMS:  [X]  denotes positive finding, [ ]  denotes negative finding Cardiac  Comments:  Chest pain or chest pressure:    Shortness of breath upon exertion: x   Short of breath when lying flat:  Irregular heart rhythm:        Vascular    Pain in calf, thigh, or hip brought on by ambulation:    Pain in feet at night that wakes you up from your sleep:     Blood clot in your veins:    Leg swelling:         Pulmonary    Oxygen at home:    Productive cough:     Wheezing:         Neurologic    Sudden weakness in arms or legs:     Sudden numbness in arms or legs:     Sudden onset of difficulty speaking or slurred speech:    Temporary loss of vision in one eye:     Problems with dizziness:         Gastrointestinal    Blood in stool:     Vomited blood:         Genitourinary    Burning when urinating:     Blood in urine:        Psychiatric    Major depression:           Hematologic    Bleeding problems:    Problems with blood clotting too easily:        Skin    Rashes or ulcers:        Constitutional    Fever or chills:     PHYSICAL EXAM:   Vitals:   10/24/17 0901  BP: 117/78  Pulse: 62  Resp: 20  SpO2: 96%  Weight: 231 lb (104.8 kg)  Height: 5\' 4"  (1.626 m)    GENERAL: The patient is a well-nourished male, in no acute distress. The vital signs are documented above. CARDIAC: There is a regular rate and rhythm.  VASCULAR: I do not detect carotid bruits. He has palpable femoral pulses. PULMONARY: There is good air exchange bilaterally without wheezing or rales. ABDOMEN:  He is morbidly obese and his abdomen is difficult to assess. MUSCULOSKELETAL: There are no major deformities or cyanosis. NEUROLOGIC: No focal weakness or paresthesias are detected. SKIN: There are no ulcers or rashes noted. PSYCHIATRIC: The patient has a normal affect.  DATA:    DUPLEX ABDOMINAL AORTIC ANEURYSM: I have independently interpreted his duplex of his abdominal aortic aneurysm.  The maximum diameter is noted to be 3.95 cm.  The iliac arteries could not be visualized.  Because of his size visualization was challenging and the image quality is not optimal.  MEDICAL ISSUES:   STATUS POST ENDOVASCULAR REPAIR OF SACCULAR ABDOMINAL AORTIC ANEURYSM AND LEFT COMMON ILIAC ARTERY ANEURYSM: Given that the duplex was limited because of his size I have recommended that we get a CT angiogram of the abdomen and pelvis to be sure that there is been no significant change in the size of his aneurysm.  He had a known type II endoleak.  In addition on a previous CT scan he had a small 7 mm left gastric artery aneurysm.  This was not mentioned on his most recent CT.  I will see him back after his CT scan.  Fortunately he is not a smoker.  [name] [facility] [phone]

## 2017-10-25 ENCOUNTER — Other Ambulatory Visit: Payer: Self-pay

## 2017-10-25 DIAGNOSIS — I713 Abdominal aortic aneurysm, ruptured, unspecified: Secondary | ICD-10-CM

## 2017-11-21 ENCOUNTER — Encounter: Payer: Self-pay | Admitting: Vascular Surgery

## 2017-11-21 ENCOUNTER — Ambulatory Visit
Admission: RE | Admit: 2017-11-21 | Discharge: 2017-11-21 | Disposition: A | Discharge status: Home / Self Care | Payer: Managed Care, Other (non HMO) | Source: Ambulatory Visit | Attending: Vascular Surgery | Admitting: Vascular Surgery

## 2017-11-21 ENCOUNTER — Ambulatory Visit (INDEPENDENT_AMBULATORY_CARE_PROVIDER_SITE_OTHER): Payer: Managed Care, Other (non HMO) | Admitting: Vascular Surgery

## 2017-11-21 ENCOUNTER — Other Ambulatory Visit: Payer: Self-pay

## 2017-11-21 VITALS — BP 110/71 | HR 63 | Temp 98.1°F | Resp 20 | Ht 64.0 in | Wt 234.0 lb

## 2017-11-21 DIAGNOSIS — I713 Abdominal aortic aneurysm, ruptured, unspecified: Secondary | ICD-10-CM

## 2017-11-21 MED ORDER — IOPAMIDOL (ISOVUE-370) INJECTION 76%
75.0000 mL | Freq: Once | INTRAVENOUS | Status: AC | PRN
  Administered 2017-11-21: 75 mL via INTRAVENOUS

## 2017-11-21 NOTE — Progress Notes (Signed)
Patient name: Nicholas Larson MRN: [id number] DOB: [birth year] Sex: male  REASON FOR VISIT:   Follow-up after endovascular aneurysm repair.  HPI:   Nicholas Larson is a pleasant 37 y.o. 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  On 05/26/2016, he underwent coil embolization of the left hypogastric artery and endovascular aneurysm repair.  He was last seen on 06/28/2016.  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  The native aneurysm sac measured 3.5 cm in maximum diameter his last follow-up.  It was 4 cm preoperatively.  Since I saw him last, he denies any abdominal pain or back pain.  He is not a smoker.  He does take aspirin, 81 mg, daily.  We have previously discussed taking a statin. He and is currently not on a statin.   Past Medical History:  Diagnosis Date  . AAA (abdominal aortic aneurysm) ([place])    s/p Repair 05/26/16  . 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 CHF (congestive heart failure) ([place])   . Hypertension   . Right renal mass     Family History  Problem Relation Age of Onset  . Hypertension Mother   . Aneurysm Father        head  . Cancer Maternal Uncle   . Aneurysm Paternal Aunt     SOCIAL HISTORY: Social History   Tobacco Use  . Smoking status: Former Smoker    Packs/day: 1.00    Types: Cigarettes    Last attempt to quit: 01/2016    Years since quitting: 1.8  . Smokeless tobacco: Never Used  Substance Use Topics  . Alcohol use: No    Comment: last drink 01/2016    No Known Allergies  Current Outpatient Medications  Medication Sig Dispense Refill  . amLODipine (NORVASC) 10 MG tablet TAKE 1 TABLET EVERY DAY 30 tablet 1  . carvedilol (COREG) 12.5 MG tablet Take 1 tablet (12.5 mg total) by mouth 2 (two)  times daily with a meal. Please make overdue appt with Dr.End for future refills 2nd attempt 30 tablet 0  . chlorthalidone (HYGROTON) 25 MG tablet Take 1 tablet (25 mg total) by mouth daily. Please make overdue yearly appt with Dr. Saunders Revel before anymore refills. 2nd attempt 15 tablet 0  . lisinopril (PRINIVIL,ZESTRIL) 40 MG tablet Take 1 tablet (40 mg total) by mouth daily. Please make overdue yearly appt with Dr. Saunders Revel before anymore refills. 2nd attempt 15 tablet 0   Current Facility-Administered Medications  Medication Dose Route Frequency Provider Last Rate Last Dose  . cloNIDine (CATAPRES) tablet 0.1 mg  0.1 mg Oral Once End, Harrell Gave, MD       Facility-Administered Medications Ordered in Other Visits  Medication Dose Route Frequency Provider Last Rate Last Dose  . magnesium citrate solution 1 Bottle  1 Bottle Oral Once Alexis Frock, MD        REVIEW OF SYSTEMS:  [X]  denotes positive finding, [ ]  denotes negative finding Cardiac  Comments:  Chest pain or chest pressure:    Shortness of breath upon exertion:    Short of breath when lying flat:    Irregular heart rhythm:        Vascular    Pain in calf, thigh, or  hip brought on by ambulation:    Pain in feet at night that wakes you up from your sleep:     Blood clot in your veins:    Leg swelling:         Pulmonary    Oxygen at home:    Productive cough:     Wheezing:         Neurologic    Sudden weakness in arms or legs:     Sudden numbness in arms or legs:     Sudden onset of difficulty speaking or slurred speech:    Temporary loss of vision in one eye:     Problems with dizziness:         Gastrointestinal    Blood in stool:     Vomited blood:         Genitourinary    Burning when urinating:     Blood in urine:        Psychiatric    Major depression:         Hematologic    Bleeding problems:    Problems with blood clotting too easily:        Skin    Rashes or ulcers:        Constitutional    Fever or  chills:     PHYSICAL EXAM:   Vitals:   11/21/17 1018  BP: 110/71  Pulse: 63  Resp: 20  Temp: 98.1 F (36.7 C)  TempSrc: Oral  SpO2: 95%  Weight: 234 lb (106.1 kg)  Height: 5\' 4"  (1.626 m)    GENERAL: The patient is a well-nourished male, in no acute distress. The vital signs are documented above. CARDIAC: There is a regular rate and rhythm.  VASCULAR: I do not detect carotid bruits. He has palpable femoral and dorsalis pedis pulses bilaterally. PULMONARY: There is good air exchange bilaterally without wheezing or rales. ABDOMEN: Soft and non-tender with normal pitched bowel sounds.  MUSCULOSKELETAL: There are no major deformities or cyanosis. NEUROLOGIC: No focal weakness or paresthesias are detected. SKIN: There are no ulcers or rashes noted. PSYCHIATRIC: The patient has a normal affect.  DATA:    CT ABDOMEN PELVIS: I have reviewed the images from his CT of the abdomen and pelvis that was done today.  His stent graft is in excellent position.  There appears to be a persistent tight 2 endoleak from his inferior mesenteric artery.  The maximum diameter of his aneurysm is 3.95 cm by my measurement.  These films have not yet been interpreted by the radiologist.  Thus the aneurysm is stable in size.  DUPLEX ABDOMINAL AORTA: I have reviewed the duplex of the abdominal aorta that was done on 10/24/2017.  This showed that the maximum diameter of the aneurysm was 3.95 cm.  MEDICAL ISSUES:   STATUS POST ENDOVASCULAR ANEURYSM REPAIR: The patient is doing well status post endovascular aneurysm repair.  The maximum diameter of the aneurysm is 3.95 cm which is not changed significantly.  He does have a persistent type II endoleak from the IMA.  I have ordered a follow-up ultrasound in 1 year and I will see him back at that time.  He knows to call sooner if he has problems.  I have also recommended that we start him on a statin however he would like to discuss this with his primary care  physician in [place].  I have explained that the literature suggests that patients with vascular disease have an overall lower mortality when  the on both a low-dose aspirin and a statin.  Fortunately he is not a smoker. [name] [phone]

## 2017-11-22 ENCOUNTER — Telehealth: Payer: Self-pay | Admitting: *Deleted

## 2017-11-22 NOTE — Telephone Encounter (Signed)
Patient does not have a PCP listed in our Epic but did list [facility] in [place]. I have called and LMTCB with their nurse to discuss this need for a MRI to evaluate the right kidney mass.

## 2017-11-22 NOTE — Telephone Encounter (Signed)
On further review of Epic notes, this patient's renal mass was actually discovered last year 06-2016 and he was sent to Dr. Junious Silk for evaluation. MRI done 07-14-2016 showed the mass being 2.2 x 2.1 x 2.1cm at that time. I called Dr. Lyndal Rainbow office at [facility] to see if any surgery was performed on this patient. The nurse there told me that he was set up for robotic assisted partial nephrectomy but no showed for his preop workups and so the surgery was cancelled. They were not able to follow up with the patient by phone or office appt and have had no contact with the patient since. I will let Dr. Scot Dock know this new information. Dr. Scot Dock tried to contact the patient last night and had left him a voicemail message to contact us about this renal mass.

## 2017-11-22 NOTE — Telephone Encounter (Signed)
-----   Message from Angelia Mould, MD sent at 11/21/2017  3:22 PM EDT ----- Regarding: MRI I saw this patient earlier today for follow-up after an EVAR.  The radiologist had not yet interpreted his films when I saw him.  I have subsequently reviewed the report.  They noted:  "2.2 cm enhancing mass in the upper pole of the right kidney. Renal cell carcinoma is not excluded. MRI of the kidneys is recommended."  Can we please notify his primary care physician to make arrangements for an MRI to follow-up mass on the right kidney.  Thank you. CD
